# Patient Record
Sex: Female | Born: 1985 | Race: Black or African American | Hispanic: No | Marital: Single | Smoking: Never smoker
Health system: Southern US, Community
[De-identification: ages and names within clinical notes are randomized; demographics above are authoritative.]

## PROBLEM LIST (undated history)

## (undated) ENCOUNTER — Ambulatory Visit (HOSPITAL_COMMUNITY): Admission: EM | Payer: Self-pay | Source: Home / Self Care

## (undated) HISTORY — PX: TYMPANOSTOMY TUBE PLACEMENT: SHX32

## (undated) HISTORY — PX: TUBAL LIGATION: SHX77

---

## 2007-02-26 ENCOUNTER — Inpatient Hospital Stay (HOSPITAL_COMMUNITY): Admission: AD | Admit: 2007-02-26 | Discharge: 2007-02-26 | Payer: Self-pay | Admitting: *Deleted

## 2007-02-26 IMAGING — US US OB COMP LESS 14 WK
1 series · 19 of 28 positions shown · non-contrast
Comparison: none

OBSTETRICAL ULTRASOUND:

 This ultrasound exam was performed in the [HOSPITAL] Ultrasound Department.  The OB US report was generated in the AS system, and faxed to the ordering physician.  This report is also available in [REDACTED] PACS.

[Series 1: us ob comp less 14 wks · 19 of 31 slices shown]
[im 1/31]
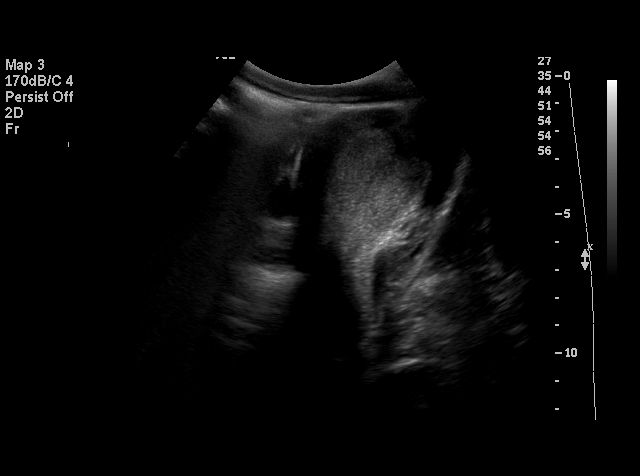
[im 3/31]
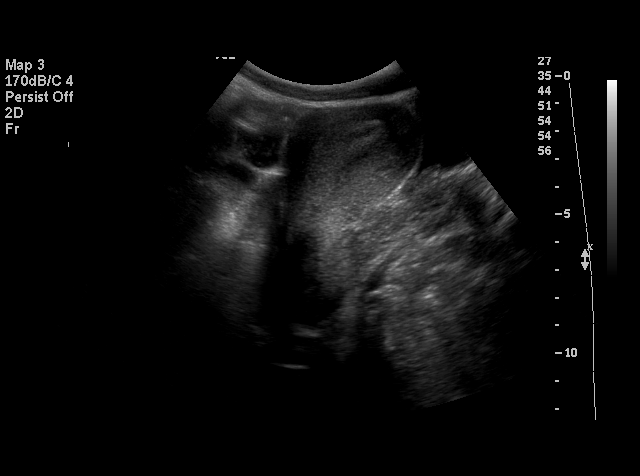
[im 4/31]
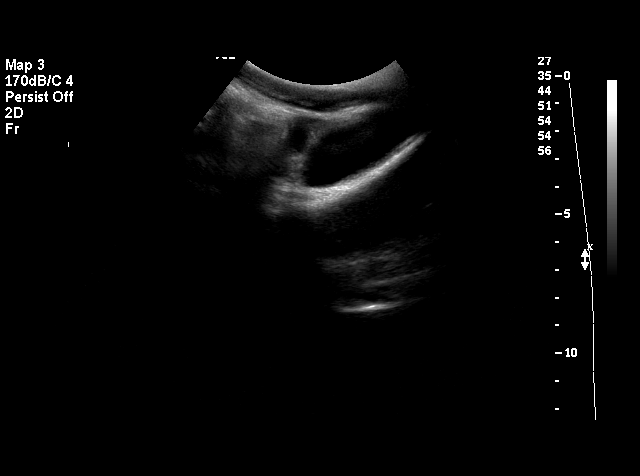
[im 6/31]
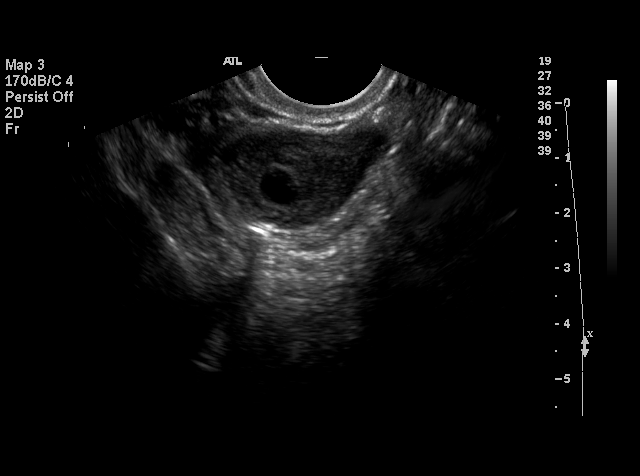
[im 7/31]
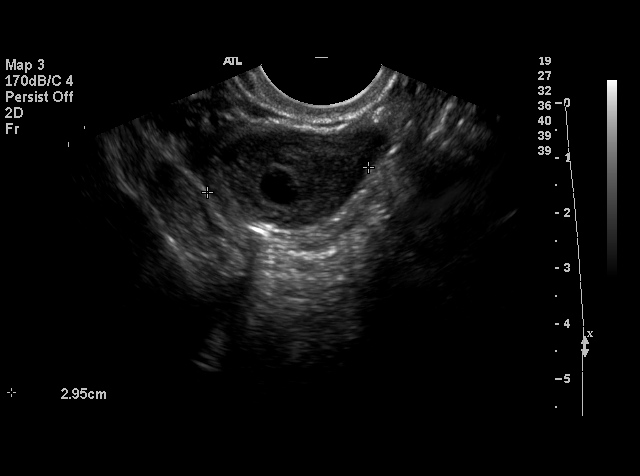
[im 9/31]
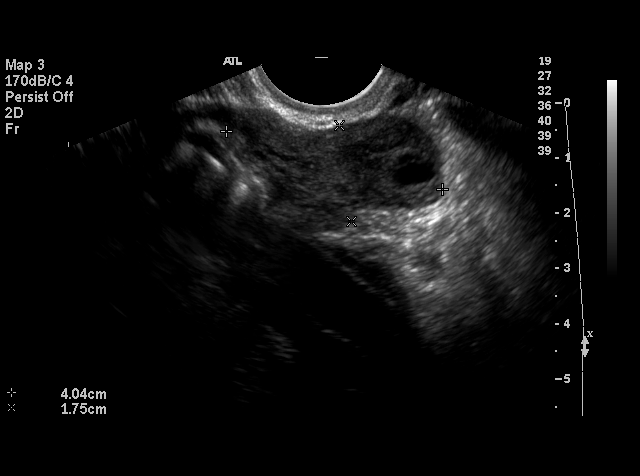
[im 11/31]
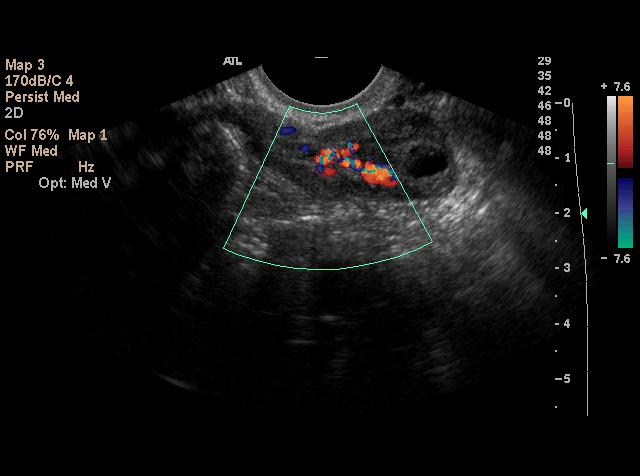
[im 13/31]
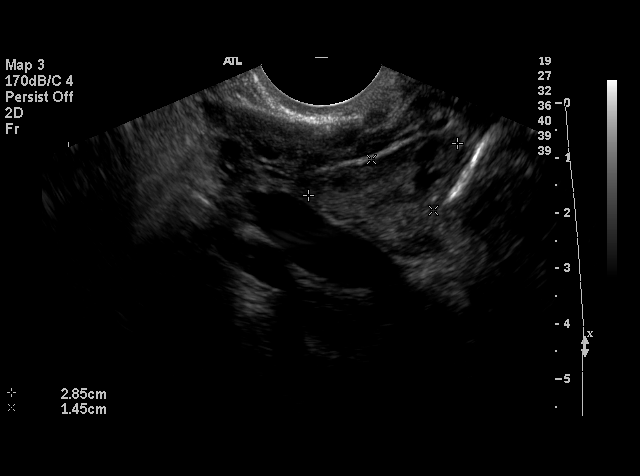
[im 14/31]
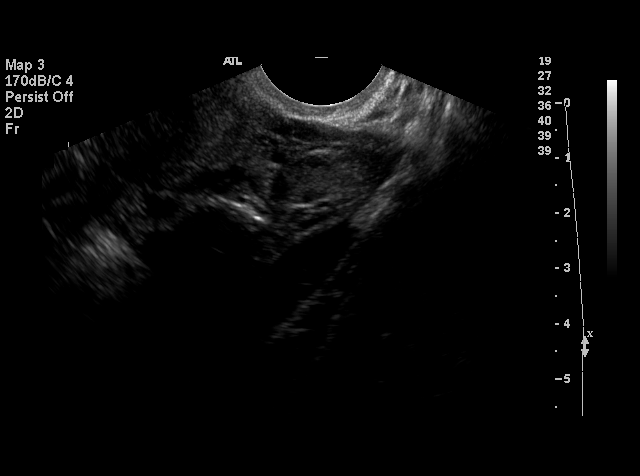
[im 16/31]
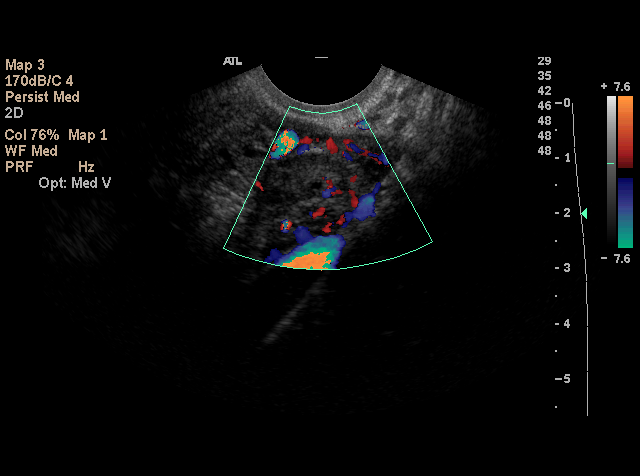
[im 17/31]
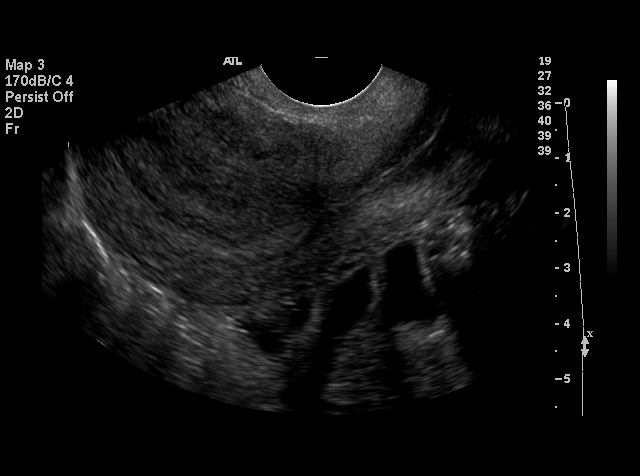
[im 18/31]
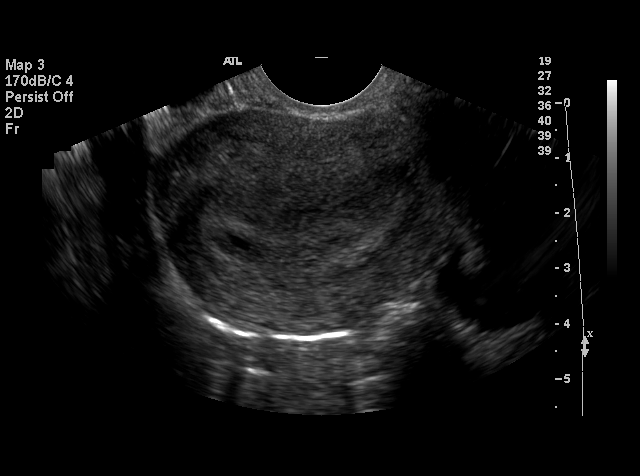
[im 21/31]
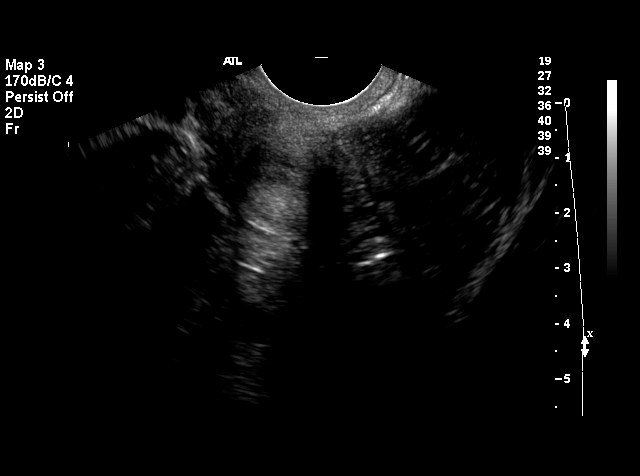
[im 22/31]
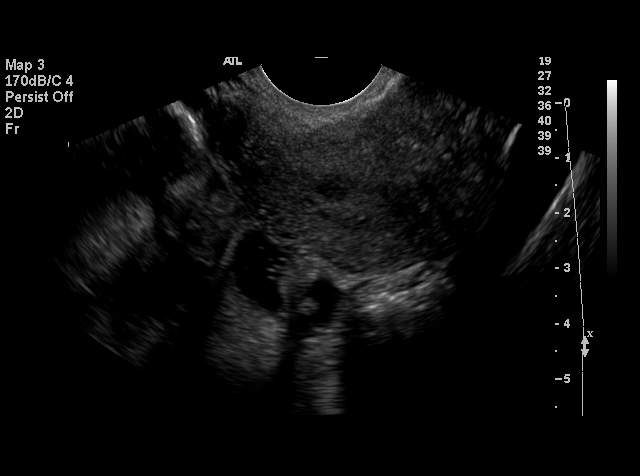
[im 24/31]
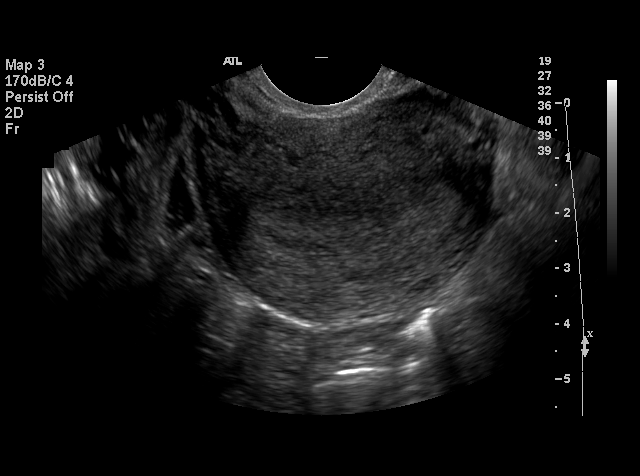
[im 25/31]
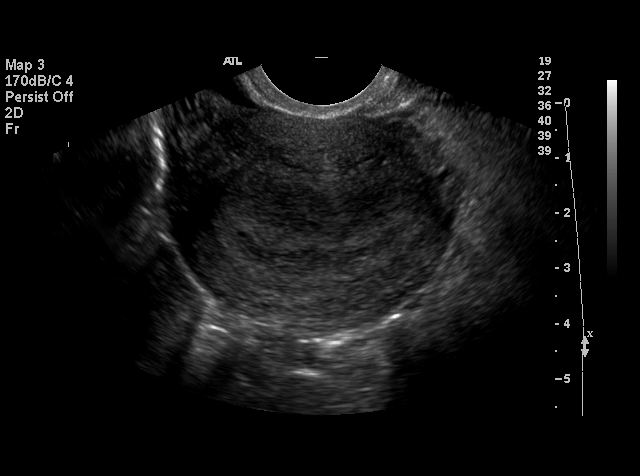
[im 27/31]
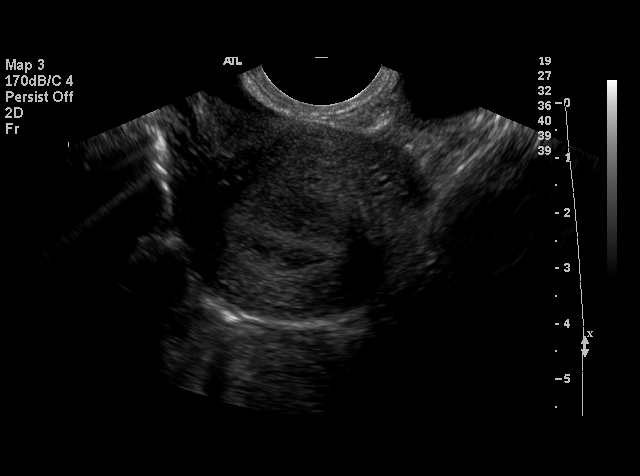
[im 28/31]
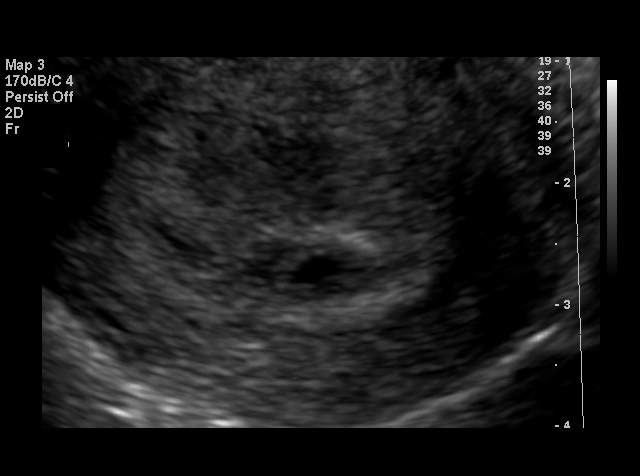
[im 31/31]
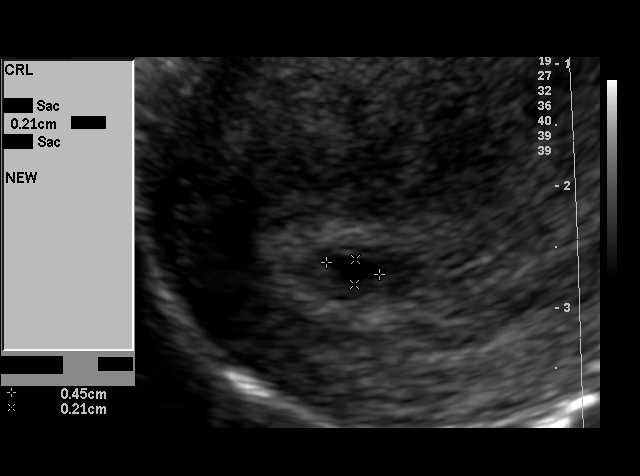

[19 of 28 positions shown; findings below may reference images not displayed]

IMPRESSION: See AS Obstetric US report.

## 2007-02-28 ENCOUNTER — Inpatient Hospital Stay (HOSPITAL_COMMUNITY): Admission: AD | Admit: 2007-02-28 | Discharge: 2007-02-28 | Payer: Self-pay | Admitting: Obstetrics & Gynecology

## 2007-03-07 ENCOUNTER — Inpatient Hospital Stay (HOSPITAL_COMMUNITY): Admission: RE | Admit: 2007-03-07 | Discharge: 2007-03-07 | Payer: Self-pay | Admitting: Obstetrics & Gynecology

## 2007-03-07 IMAGING — US US OB TRANSVAGINAL
1 series · 14 of 23 positions shown · non-contrast
Comparison: none

OBSTETRICAL ULTRASOUND:

 This ultrasound exam was performed in the [HOSPITAL] Ultrasound Department.  The OB US report was generated in the AS system, and faxed to the ordering physician.  This report is also available in [REDACTED] PACS.

[Series 1: us ob transvaginal · 14 of 23 slices shown]
[im 1/23]
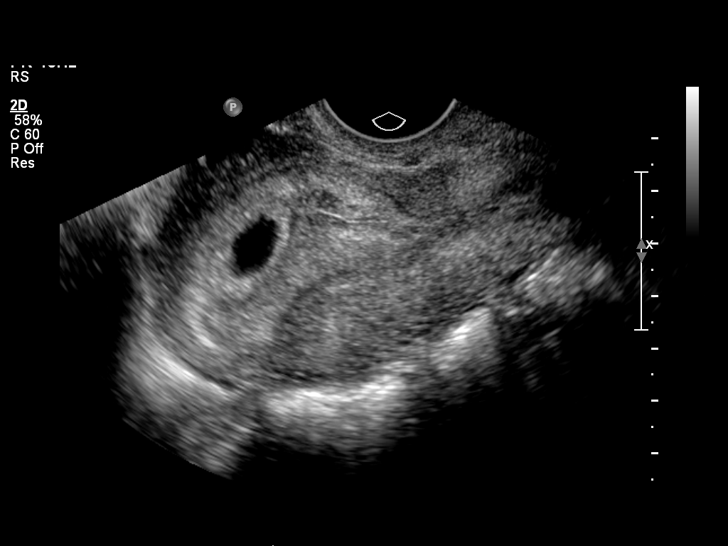
[im 3/23]
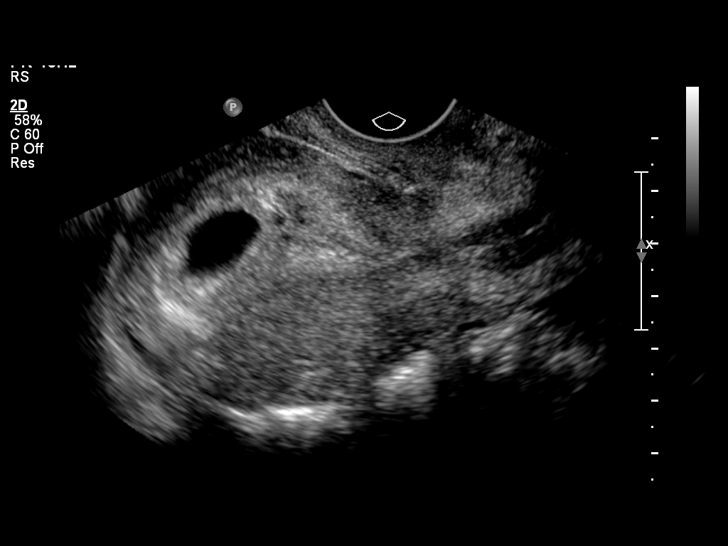
[im 5/23]
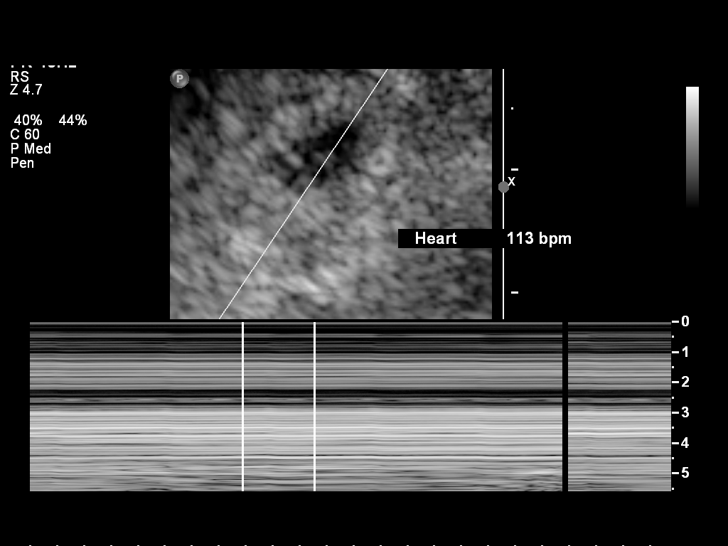
[im 6/23]
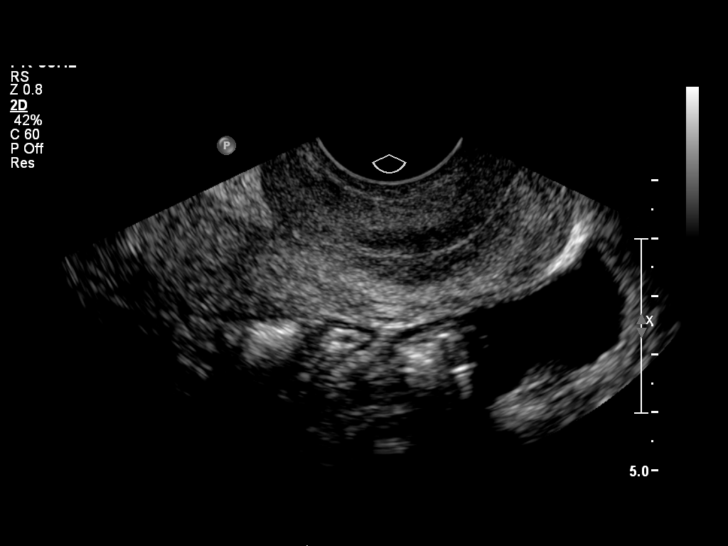
[im 8/23]
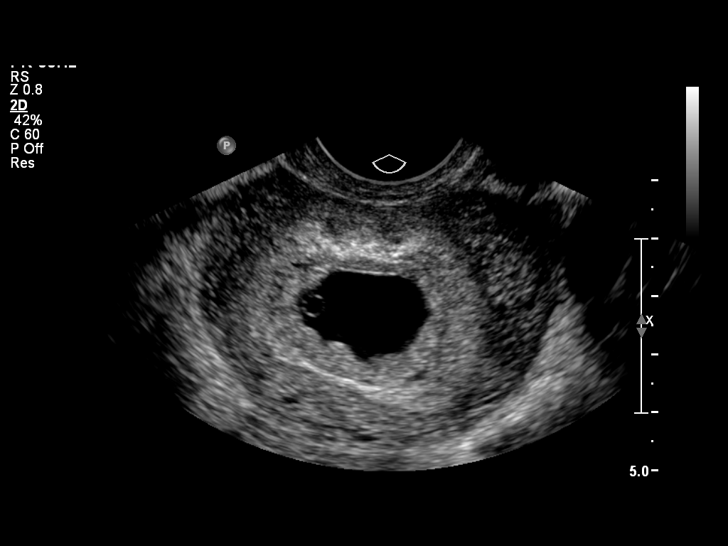
[im 10/23]
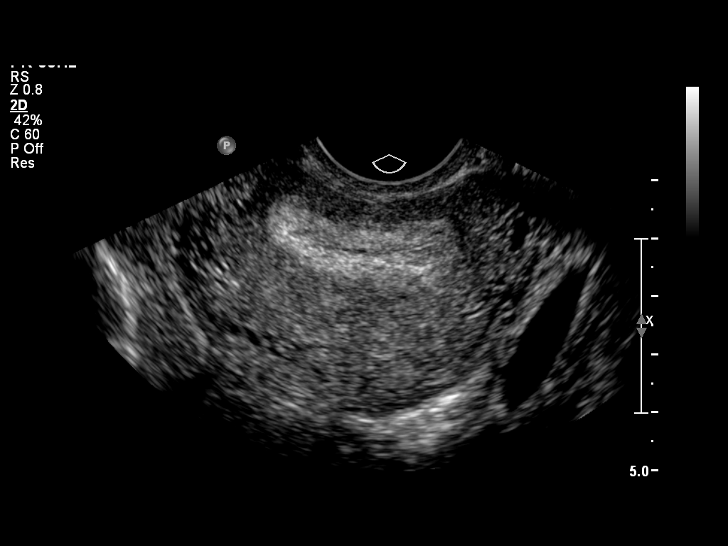
[im 11/23]
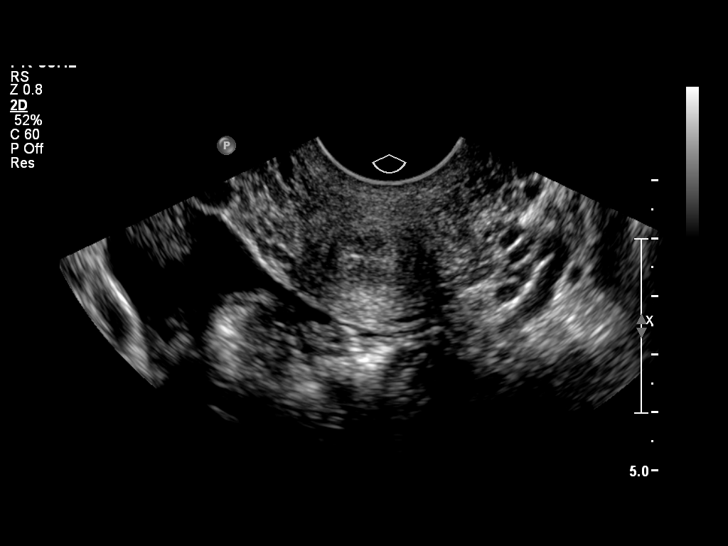
[im 13/23]
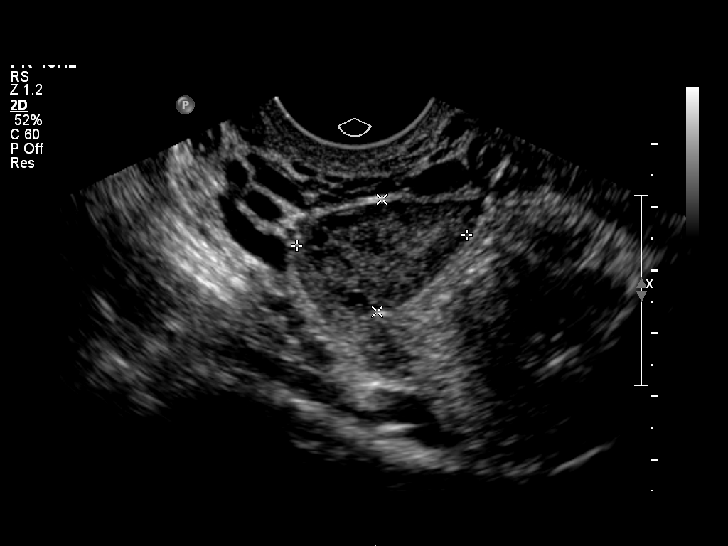
[im 14/23]
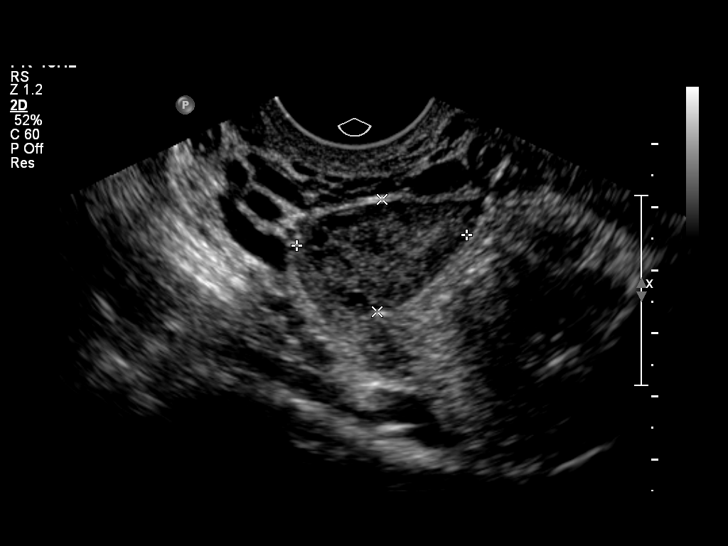
[im 16/23]
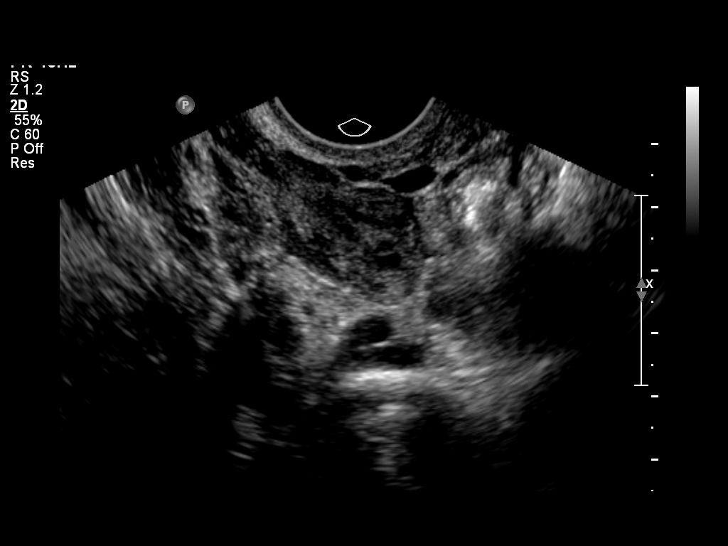
[im 18/23]
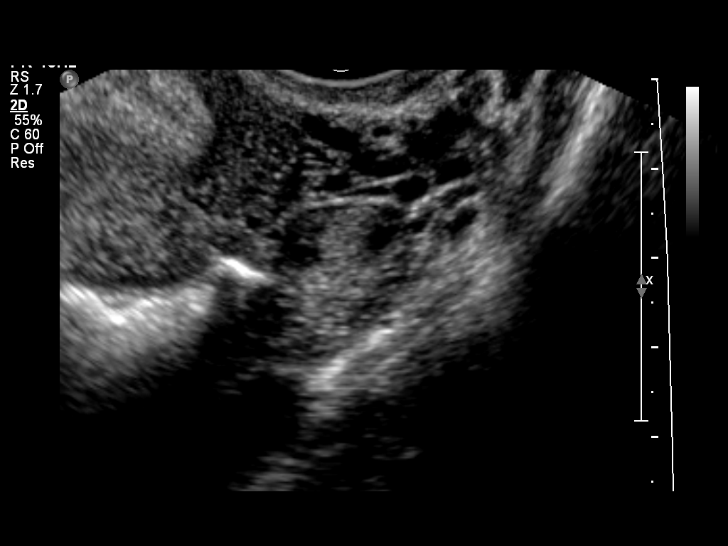
[im 19/23]
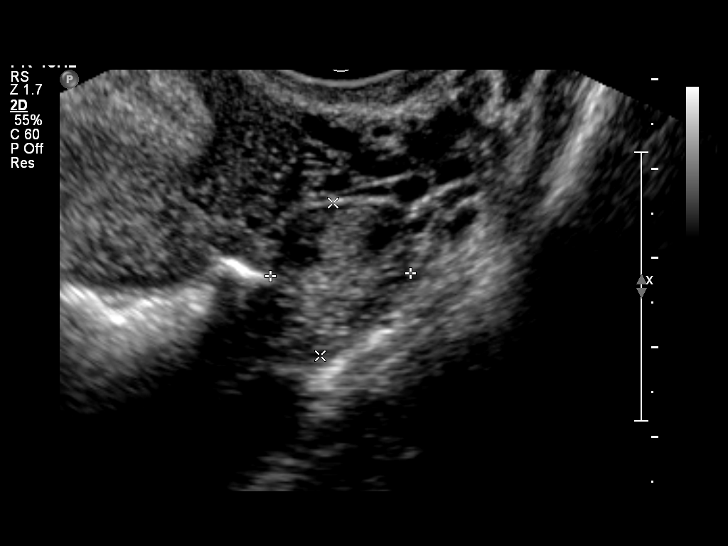
[im 21/23]
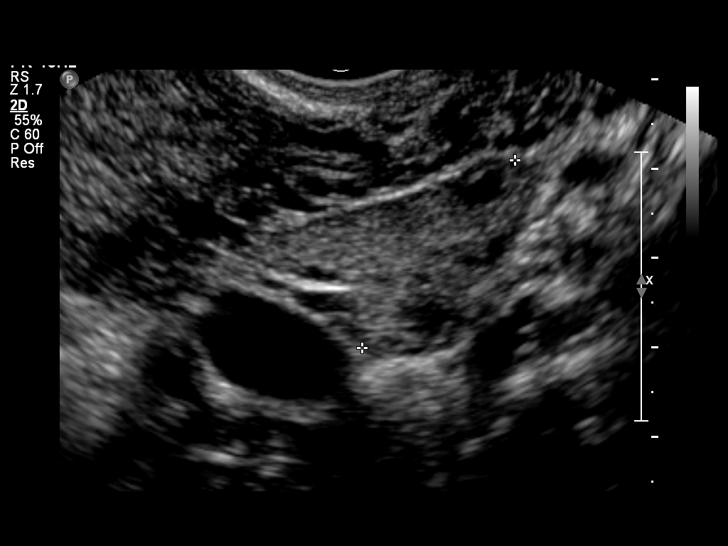
[im 23/23]
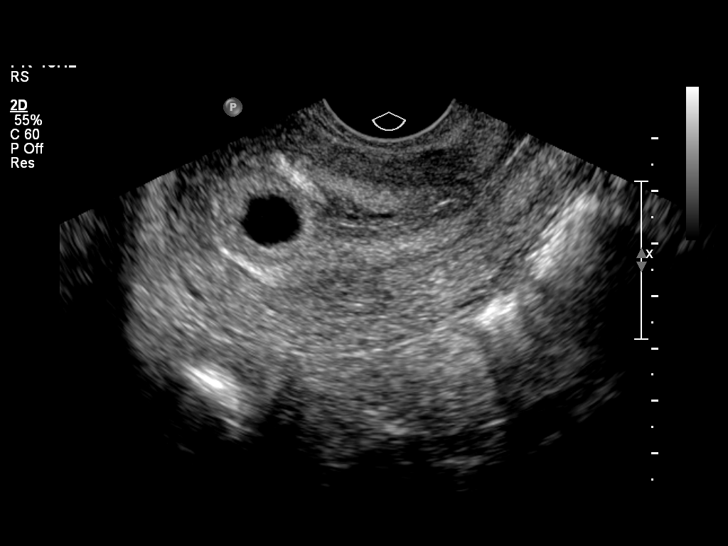

[14 of 23 positions shown; findings below may reference images not displayed]

IMPRESSION: See AS Obstetric US report.

## 2011-04-01 LAB — URINALYSIS, ROUTINE W REFLEX MICROSCOPIC
Bilirubin Urine: NEGATIVE
Glucose, UA: NEGATIVE
Hgb urine dipstick: NEGATIVE
Ketones, ur: NEGATIVE
Protein, ur: NEGATIVE
Urobilinogen, UA: 0.2

## 2011-04-01 LAB — HCG, QUANTITATIVE, PREGNANCY: hCG, Beta Chain, Quant, S: 2806 — ABNORMAL HIGH

## 2011-04-01 LAB — CBC
HCT: 33.8 — ABNORMAL LOW
Platelets: 108 — ABNORMAL LOW
RDW: 16.8 — ABNORMAL HIGH
WBC: 9.7

## 2011-04-01 LAB — WET PREP, GENITAL
Clue Cells Wet Prep HPF POC: NONE SEEN
Trich, Wet Prep: NONE SEEN

## 2011-04-01 LAB — GC/CHLAMYDIA PROBE AMP, GENITAL
Chlamydia, DNA Probe: NEGATIVE
GC Probe Amp, Genital: NEGATIVE

## 2011-04-01 LAB — POCT PREGNANCY, URINE: Preg Test, Ur: POSITIVE

## 2015-05-30 ENCOUNTER — Encounter (HOSPITAL_BASED_OUTPATIENT_CLINIC_OR_DEPARTMENT_OTHER): Payer: Self-pay | Admitting: *Deleted

## 2015-05-30 ENCOUNTER — Emergency Department (HOSPITAL_BASED_OUTPATIENT_CLINIC_OR_DEPARTMENT_OTHER)
Admission: EM | Admit: 2015-05-30 | Discharge: 2015-05-30 | Disposition: A | Discharge status: Home / Self Care | Payer: 59 | Attending: Emergency Medicine | Admitting: Emergency Medicine

## 2015-05-30 DIAGNOSIS — H9203 Otalgia, bilateral: Secondary | ICD-10-CM | POA: Diagnosis not present

## 2015-05-30 DIAGNOSIS — Z79899 Other long term (current) drug therapy: Secondary | ICD-10-CM | POA: Diagnosis not present

## 2015-05-30 DIAGNOSIS — O9989 Other specified diseases and conditions complicating pregnancy, childbirth and the puerperium: Secondary | ICD-10-CM | POA: Diagnosis not present

## 2015-05-30 DIAGNOSIS — Z880 Allergy status to penicillin: Secondary | ICD-10-CM | POA: Diagnosis not present

## 2015-05-30 DIAGNOSIS — O99511 Diseases of the respiratory system complicating pregnancy, first trimester: Secondary | ICD-10-CM | POA: Insufficient documentation

## 2015-05-30 DIAGNOSIS — J069 Acute upper respiratory infection, unspecified: Secondary | ICD-10-CM

## 2015-05-30 NOTE — ED Notes (Signed)
Sinus congestion, bilateral ear pain, pressure behind forehead x 3 days- pt is 3 months pregnant. States she has taken benadryl and tylenol severe sinus last evening

## 2015-05-30 NOTE — ED Provider Notes (Signed)
CSN: [id number]     Arrival date & time 05/30/15  4098 History   First MD Initiated Contact with Patient 05/30/15 1023     Chief Complaint  Patient presents with  . Otalgia     (Consider location/radiation/quality/duration/timing/severity/associated sxs/prior Treatment) HPI  29 year old female who is about 3 months pregnant presents with 3 days of bilateral ear pain/pressure, for head pain/pressure and rhinorrhea. Mild dry cough. Denies fevers or shortness of breath. Has tried some Tylenol and Benadryl can she thought this might be allergic related. Has had some forehead pressure as well. Initially started with a sore throat but that has since resolved. No trouble speaking or swallowing. No neck pain or stiffness. Her rhinorrhea is clear, one time was yellow but denies any green or blood.  Denies vaginal bleeding, abdominal pain, vomiting, or urinary symptoms.  History reviewed. No pertinent past medical history. Past Surgical History  Procedure Laterality Date  . Tympanostomy tube placement     No family history on file. Social History  Substance Use Topics  . Smoking status: Never Smoker   . Smokeless tobacco: Never Used  . Alcohol Use: No   OB History    Gravida Para Term Preterm AB TAB SAB Ectopic Multiple Living   1              Review of Systems  Constitutional: Negative for fever.  HENT: Positive for congestion, ear pain, rhinorrhea, sinus pressure and sore throat. Negative for trouble swallowing and voice change.   Respiratory: Positive for cough.   Cardiovascular: Negative for chest pain.  Gastrointestinal: Negative for vomiting.  All other systems reviewed and are negative.     Allergies  Amoxicillin  Home Medications   Prior to Admission medications   Medication Sig Start Date End Date Taking? Authorizing Provider  Prenatal Vit-Fe Fumarate-FA (PRENATAL MULTIVITAMIN) TABS tablet Take 1 tablet by mouth daily at 12 noon.   Yes Historical Provider, MD   BP  108/61 mmHg  Pulse 90  Temp(Src) 98.4 F (36.9 C) (Oral)  Resp 16  Ht  (1.676 m)  Wt 123 lb (55.792 kg)  BMI 19.86 kg/m2  SpO2 100%  LMP 03/02/2015 Physical Exam  Constitutional: She is oriented to person, place, and time. She appears well-developed and well-nourished.  HENT:  Head: Normocephalic and atraumatic.  Right Ear: Tympanic membrane, external ear and ear canal normal.  Left Ear: Tympanic membrane, external ear and ear canal normal.  Nose: Nose normal. Right sinus exhibits no maxillary sinus tenderness and no frontal sinus tenderness. Left sinus exhibits no maxillary sinus tenderness and no frontal sinus tenderness.  Eyes: Right eye exhibits no discharge. Left eye exhibits no discharge.  Cardiovascular: Normal rate, regular rhythm and normal heart sounds.   Pulmonary/Chest: Effort normal and breath sounds normal.  Abdominal: Soft. There is no tenderness.  Neurological: She is alert and oriented to person, place, and time.  Skin: Skin is warm and dry.  Nursing note and vitals reviewed.   ED Course  Procedures (including critical care time) Labs Review Labs Reviewed - No data to display  Imaging Review No results found. I have personally reviewed and evaluated these images and lab results as part of my medical decision-making.   EKG Interpretation None      MDM   Final diagnoses:  Upper respiratory infection    Patient symptoms consistent with mild upper respiratory infection. No fevers. Highly doubt bacterial sinusitis or other acute bacterial illness. Lungs are clear with only a  minimal cough that is likely more congestion related. Given she is pregnant I have recommended she discuss with the pharmacist at a drugstore before buying any symptomatic care. She is in no distress.    Pricilla LovelessScott Burnett Spray, MD 05/30/15 [phone]

## 2021-07-24 ENCOUNTER — Emergency Department (HOSPITAL_BASED_OUTPATIENT_CLINIC_OR_DEPARTMENT_OTHER)
Admission: EM | Admit: 2021-07-24 | Discharge: 2021-07-24 | Disposition: A | Discharge status: Home / Self Care | Payer: 59 | Attending: Emergency Medicine | Admitting: Emergency Medicine

## 2021-07-24 ENCOUNTER — Encounter (HOSPITAL_BASED_OUTPATIENT_CLINIC_OR_DEPARTMENT_OTHER): Payer: Self-pay | Admitting: Emergency Medicine

## 2021-07-24 ENCOUNTER — Other Ambulatory Visit: Payer: Self-pay

## 2021-07-24 DIAGNOSIS — K644 Residual hemorrhoidal skin tags: Secondary | ICD-10-CM | POA: Diagnosis present

## 2021-07-24 DIAGNOSIS — K649 Unspecified hemorrhoids: Secondary | ICD-10-CM

## 2021-07-24 MED ORDER — POLYETHYLENE GLYCOL 3350 17 GM/SCOOP PO POWD: 17.0000 g | Freq: Every day | ORAL | 0 refills | Status: DC

## 2021-07-24 NOTE — ED Provider Notes (Signed)
[place] EMERGENCY DEPARTMENT Provider Note   CSN: [id number] Arrival date & time: 07/24/21  1044     History Chief Complaint  Patient presents with   Rectal Bleeding    Kayla Ayala is a 36 y.o. female presents to the ED for evaluation of hemorrhoids and some bright red blood per rectum and on toilet tissue last night and some this morning.  She has been experiencing hemorrhoids for the past 2 weeks intermittently.  She was seen by her OB/GYN a few days ago and given hydrocortisone cream for them.  She reports she is still having pain and noticed the blood on the tissue last night.  She reports she has had this before with her other hemorrhoids.  Denies any dysuria, hematuria, urgency urgency/frequency.  Denies any abdominal pain.  She reports she has been constipated off and on for the past 2 weeks. She tried a laxative last week and found resolution of pain and symptoms, but the pain returned. She reports that she drinks water and stays active with her job.    Rectal Bleeding Associated symptoms: no abdominal pain, no fever and no vomiting       Home Medications Prior to Admission medications   Medication Sig Start Date End Date Taking? Authorizing Provider  Prenatal Vit-Fe Fumarate-FA (PRENATAL MULTIVITAMIN) TABS tablet Take 1 tablet by mouth daily at 12 noon.    [provider]      Allergies    Amoxicillin    Review of Systems   Review of Systems  Constitutional:  Negative for chills and fever.  Gastrointestinal:  Positive for anal bleeding, constipation, hematochezia and rectal pain. Negative for abdominal pain, nausea and vomiting.  Genitourinary:  Negative for dysuria, frequency, hematuria and urgency.   Physical Exam Updated Vital Signs BP 113/72 (BP Location: Right Arm)    Pulse (!) 119    Temp 98.6 F (37 C) (Oral)    Resp 18    Ht 5\' 3"  (1.6 m)    Wt 67.1 kg    LMP 07/07/2021    SpO2 100%    BMI 26.22 kg/m  Physical Exam Vitals and  nursing note reviewed. Exam conducted with a chaperone present Apolonio Schneiders, Therapist, sports).  Constitutional:      General: She is not in acute distress.    Appearance: Normal appearance. She is not toxic-appearing.  HENT:     Head: Normocephalic and atraumatic.  Eyes:     General: No scleral icterus. Cardiovascular:     Rate and Rhythm: Normal rate and regular rhythm.  Pulmonary:     Effort: Pulmonary effort is normal. No respiratory distress.     Breath sounds: Normal breath sounds.  Abdominal:     General: Bowel sounds are normal. There is no distension.     Palpations: Abdomen is soft.     Tenderness: There is no abdominal tenderness. There is no guarding or rebound.  Genitourinary:    Rectum: Tenderness and external hemorrhoid present.     Comments: Small, pea-sized hemorrhoid at the 6 o'clock position. No bleeding. Not thrombosed. Clay colored stool noted. Tenderness. Normal rectal tone. Musculoskeletal:        General: No deformity.     Cervical back: Normal range of motion.  Skin:    General: Skin is warm and dry.  Neurological:     General: No focal deficit present.     Mental Status: She is alert. Mental status is at baseline.    ED Results /  Procedures / Treatments   Labs (all labs ordered are listed, but only abnormal results are displayed) Labs Reviewed - No data to display  EKG None  Radiology No results found.  Procedures Procedures   Medications Ordered in ED Medications - No data to display  ED Course/ Medical Decision Making/ A&P                           Medical Decision Making  36 year old female presents emergency room for evaluation of rectal pain, bleeding, and constipation.  Differential diagnosis includes but is not limited to thrombosed hemorrhoid, hemorrhoid, fistula, abscess.  Vital signs initially show tachycardia, however this is improved.  Hemodynamically stable currently.  Physical exam pertinent for a small pea-sized hemorrhoid located at the 6  o'clock position of the rectum.  No palpable abscess or induration noted.  Low suspicion for fistula at this time.  No bleeding noted on digital rectal exam.  Clay colored stool.  Recommended MiraLAX until she produces a bowel movement with the consistency of peanut butter.  Additionally recommended for her to continue using the hydrocortisone topical ointment.  I discussed with her that she will need to relieve her constipation as this is what is likely causing her hemorrhoid pain.  Recommended increase in fluid, activity, and fiber.  Additionally, gave information to the general surgeon for follow-up with if this problem does not improve.  Strict return precautions discussed.  Patient agrees with plan.  Patient is stable being discharged home in good condition.  Final Clinical Impression(s) / ED Diagnoses Final diagnoses:  Hemorrhoids, unspecified hemorrhoid type    Rx / DC Orders ED Discharge Orders          Ordered    polyethylene glycol powder (MIRALAX) 17 GM/SCOOP powder  Daily        07/24/21 1156              Sherrell Puller, PA-C 07/24/21 1201    Malvin Johns, MD 07/24/21 1217

## 2021-07-24 NOTE — Discharge Instructions (Addendum)
You were seen here today for evaluation of your rectal pain. You have a small hemmorhoid. Please continue using topical steroid cream given to you by your gynecologist.  Additionally, I have written you a prescription for MiraLAX.  If your insurance does not cover this, you can pick up MiraLAX or generic of it over-the-counter.  Please take 2 capfuls a day with plenty of water.  Additionally I would recommend adding fiber into your diet to relieve some your constipation which is likely the source of your hemorrhoids.  If you do not have any relief with this, I have given the information of a general surgeon to follow-up with.  If you have any worsening rectal pain, increased bleeding, abdominal pain, dark or tarry stools, please return the nearest emergency department for reevaluation.

## 2021-07-24 NOTE — ED Triage Notes (Signed)
Pt arrives pov, ambulatory to triage with c/o bloody stool last night with rectal pain/pressure. Reports constipation. Denies fever, reports  lower abdominal cramping, endorses time for menstrual cycle.

## 2023-01-16 ENCOUNTER — Emergency Department (HOSPITAL_BASED_OUTPATIENT_CLINIC_OR_DEPARTMENT_OTHER)
Admission: EM | Admit: 2023-01-16 | Discharge: 2023-01-16 | Disposition: A | Discharge status: Home / Self Care | Payer: 59 | Attending: Emergency Medicine | Admitting: Emergency Medicine

## 2023-01-16 ENCOUNTER — Other Ambulatory Visit: Payer: Self-pay

## 2023-01-16 ENCOUNTER — Emergency Department (HOSPITAL_BASED_OUTPATIENT_CLINIC_OR_DEPARTMENT_OTHER): Payer: 59

## 2023-01-16 ENCOUNTER — Encounter (HOSPITAL_BASED_OUTPATIENT_CLINIC_OR_DEPARTMENT_OTHER): Payer: Self-pay | Admitting: Emergency Medicine

## 2023-01-16 DIAGNOSIS — K8020 Calculus of gallbladder without cholecystitis without obstruction: Secondary | ICD-10-CM | POA: Insufficient documentation

## 2023-01-16 DIAGNOSIS — N83202 Unspecified ovarian cyst, left side: Secondary | ICD-10-CM | POA: Insufficient documentation

## 2023-01-16 DIAGNOSIS — R1030 Lower abdominal pain, unspecified: Secondary | ICD-10-CM | POA: Diagnosis present

## 2023-01-16 LAB — CBC
HCT: 35.6 % — ABNORMAL LOW (ref 36.0–46.0)
Hemoglobin: 11.6 g/dL — ABNORMAL LOW (ref 12.0–15.0)
MCH: 30 pg (ref 26.0–34.0)
MCHC: 32.6 g/dL (ref 30.0–36.0)
MCV: 92 fL (ref 80.0–100.0)
Platelets: 220 10*3/uL (ref 150–400)
RBC: 3.87 MIL/uL (ref 3.87–5.11)
RDW: 14.2 % (ref 11.5–15.5)
WBC: 10.9 10*3/uL — ABNORMAL HIGH (ref 4.0–10.5)
nRBC: 0 % (ref 0.0–0.2)

## 2023-01-16 LAB — COMPREHENSIVE METABOLIC PANEL
ALT: 12 U/L (ref 0–44)
AST: 18 U/L (ref 15–41)
Albumin: 4.6 g/dL (ref 3.5–5.0)
Alkaline Phosphatase: 59 U/L (ref 38–126)
Anion gap: 9 (ref 5–15)
BUN: 6 mg/dL (ref 6–20)
CO2: 27 mmol/L (ref 22–32)
Calcium: 9.4 mg/dL (ref 8.9–10.3)
Chloride: 101 mmol/L (ref 98–111)
Creatinine, Ser: 0.77 mg/dL (ref 0.44–1.00)
GFR, Estimated: >60 mL/min (ref >60)
Glucose, Bld: 108 mg/dL — ABNORMAL HIGH (ref 70–99)
Potassium: 3.8 mmol/L (ref 3.5–5.1)
Sodium: 137 mmol/L (ref 135–145)
Total Bilirubin: 0.3 mg/dL (ref 0.3–1.2)
Total Protein: 7.7 g/dL (ref 6.5–8.1)

## 2023-01-16 LAB — URINALYSIS, ROUTINE W REFLEX MICROSCOPIC
Bilirubin Urine: NEGATIVE
Glucose, UA: NEGATIVE mg/dL
Hgb urine dipstick: NEGATIVE
Ketones, ur: NEGATIVE mg/dL
Leukocytes,Ua: NEGATIVE
Nitrite: NEGATIVE
Protein, ur: NEGATIVE mg/dL
Specific Gravity, Urine: 1.009 (ref 1.005–1.030)
pH: 6.5 (ref 5.0–8.0)

## 2023-01-16 LAB — LIPASE, BLOOD: Lipase: 19 U/L (ref 11–51)

## 2023-01-16 LAB — PREGNANCY, URINE: Preg Test, Ur: NEGATIVE

## 2023-01-16 MED ORDER — KETOROLAC TROMETHAMINE 30 MG/ML IJ SOLN
15.0000 mg | Freq: Once | INTRAMUSCULAR | Status: AC
  Administered 2023-01-16: 15 mg via INTRAVENOUS
  Filled 2023-01-16: qty 1

## 2023-01-16 MED ORDER — NAPROXEN 500 MG PO TABS: 500.0000 mg | ORAL_TABLET | Freq: Two times a day (BID) | ORAL | 0 refills | Status: DC

## 2023-01-16 MED ORDER — ONDANSETRON 4 MG PO TBDP
4.0000 mg | ORAL_TABLET | Freq: Once | ORAL | Status: AC | PRN
  Administered 2023-01-16: 4 mg via ORAL
  Filled 2023-01-16: qty 1

## 2023-01-16 MED ORDER — IOHEXOL 300 MG/ML  SOLN
100.0000 mL | Freq: Once | INTRAMUSCULAR | Status: AC | PRN
  Administered 2023-01-16: 100 mL via INTRAVENOUS

## 2023-01-16 NOTE — ED Provider Notes (Signed)
EMERGENCY DEPARTMENT AT [facility]  Provider Note  CSN: [id number] Arrival date & time: 01/16/23 0108  History Chief Complaint  Patient presents with   Abdominal Pain    Kayla Ayala is a 37 y.o. female with no significant PMH reports onset of lower abdominal cramping earlier in the day on 7/28 she thought might be onset of menses. She has not had a period in over a month, not uncommon for her to be irregular. Later in the evening pain intensified and was in her lower back as well which is unusual for her. She had some nausea but no vomiting, diarrhea, constipation, dysuria or vaginal discharge. She is feeling some better since arrival but still having mild residual pain.    Home Medications Prior to Admission medications   Medication Sig Start Date End Date Taking? Authorizing Provider  naproxen (NAPROSYN) 500 MG tablet Take 1 tablet (500 mg total) by mouth 2 (two) times daily. 01/16/23  Yes Pollyann Savoy, MD  polyethylene glycol powder [facility]) 17 GM/SCOOP powder Take 17 g by mouth daily. 07/24/21   Achille Rich, PA-C  Prenatal Vit-Fe Fumarate-FA (PRENATAL MULTIVITAMIN) TABS tablet Take 1 tablet by mouth daily at 12 noon.    [provider]     Allergies    Amoxicillin   Review of Systems   Review of Systems Please see HPI for pertinent positives and negatives  Physical Exam BP 117/82   Pulse 90   Temp 97.9 F (36.6 C) (Oral)   Resp 18   Wt 72.6 kg   LMP 12/01/2022 (Approximate)   SpO2 100%   BMI 28.34 kg/m   Physical Exam Vitals and nursing note reviewed.  Constitutional:      Appearance: Normal appearance.  HENT:     Head: Normocephalic and atraumatic.     Nose: Nose normal.     Mouth/Throat:     Mouth: Mucous membranes are moist.  Eyes:     Extraocular Movements: Extraocular movements intact.     Conjunctiva/sclera: Conjunctivae normal.  Cardiovascular:     Rate and Rhythm: Normal rate.  Pulmonary:     Effort:  Pulmonary effort is normal.     Breath sounds: Normal breath sounds.  Abdominal:     General: Abdomen is flat.     Palpations: Abdomen is soft.     Tenderness: There is no abdominal tenderness. There is no guarding. Negative signs include Murphy's sign and McBurney's sign.  Musculoskeletal:        General: No swelling. Normal range of motion.     Cervical back: Neck supple.  Skin:    General: Skin is warm and dry.  Neurological:     General: No focal deficit present.     Mental Status: She is alert.  Psychiatric:        Mood and Affect: Mood normal.     ED Results / Procedures / Treatments   EKG None  Procedures Procedures  Medications Ordered in the ED Medications  ondansetron (ZOFRAN-ODT) disintegrating tablet 4 mg (4 mg Oral Given 01/16/23 0125)  ketorolac (TORADOL) 30 MG/ML injection 15 mg (15 mg Intravenous Given 01/16/23 0407)  iohexol (OMNIPAQUE) 300 MG/ML solution 100 mL (100 mLs Intravenous Contrast Given 01/16/23 0456)    Initial Impression and Plan  Patient here with cramping lower abdominal pain in setting of irregular menses. Some pain radiating into back, consider renal colic vs dysmenorrhea. Less likely vascular problem, pelvic infection or endometriosis. Labs done in triage  show unremarkable CBC, CMP, Lipase, HCG and UA. Will give a dose of toradol for pain and send for CT to rule out acute process.   ED Course   Clinical Course as of 01/16/23 0551  Mon Jan 16, 2023  4259 I personally viewed the images from radiology studies and agree with radiologist interpretation:  CT neg for acute process. There are gall stones, not likely causing her symptoms and a hemorrhagic ovarian cyst which might be contributing. Her pain is well controlled now. Plan discharged with Rx for naprosyn and outpatiient Gyn follow up. RTED for any other concerns.  [CS]    Clinical Course User Index [CS] Pollyann Savoy, MD     MDM Rules/Calculators/A&P Medical Decision  Making Problems Addressed: Cyst of left ovary: acute illness or injury Gall stones: acute illness or injury  Amount and/or Complexity of Data Reviewed Labs: ordered. Decision-making details documented in ED Course. Radiology: ordered and independent interpretation performed. Decision-making details documented in ED Course.  Risk Prescription drug management.     Final Clinical Impression(s) / ED Diagnoses Final diagnoses:  Cyst of left ovary  Gall stones    Rx / DC Orders ED Discharge Orders          Ordered    naproxen (NAPROSYN) 500 MG tablet  2 times daily        01/16/23 0551             Pollyann Savoy, MD 01/16/23 [phone]

## 2023-01-16 NOTE — ED Triage Notes (Addendum)
Presents for lower abd cramping starting yesterday and lower midline back pain that woke her from sleep. Unable to get comfortable.  Endorses nausea Denies fever, urinary sx, recent falls, vaginal bleeding or discharge Last BM Sunday afternoon.    H/o tubal ligation

## 2023-02-02 ENCOUNTER — Encounter (HOSPITAL_BASED_OUTPATIENT_CLINIC_OR_DEPARTMENT_OTHER): Payer: Self-pay | Admitting: Emergency Medicine

## 2023-02-02 ENCOUNTER — Other Ambulatory Visit: Payer: Self-pay

## 2023-02-02 DIAGNOSIS — R1032 Left lower quadrant pain: Secondary | ICD-10-CM | POA: Insufficient documentation

## 2023-02-02 LAB — COMPREHENSIVE METABOLIC PANEL
ALT: 11 U/L (ref 0–44)
AST: 16 U/L (ref 15–41)
Albumin: 4.6 g/dL (ref 3.5–5.0)
Alkaline Phosphatase: 62 U/L (ref 38–126)
Anion gap: 10 (ref 5–15)
BUN: 9 mg/dL (ref 6–20)
CO2: 28 mmol/L (ref 22–32)
Calcium: 9.7 mg/dL (ref 8.9–10.3)
Chloride: 100 mmol/L (ref 98–111)
Creatinine, Ser: 0.82 mg/dL (ref 0.44–1.00)
GFR, Estimated: >60 mL/min (ref >60)
Glucose, Bld: 104 mg/dL — ABNORMAL HIGH (ref 70–99)
Potassium: 3.4 mmol/L — ABNORMAL LOW (ref 3.5–5.1)
Sodium: 138 mmol/L (ref 135–145)
Total Bilirubin: 0.4 mg/dL (ref 0.3–1.2)
Total Protein: 7.8 g/dL (ref 6.5–8.1)

## 2023-02-02 LAB — URINALYSIS, ROUTINE W REFLEX MICROSCOPIC
Bacteria, UA: NONE SEEN
Bilirubin Urine: NEGATIVE
Glucose, UA: NEGATIVE mg/dL
Ketones, ur: 15 mg/dL — AB
Leukocytes,Ua: NEGATIVE
Nitrite: NEGATIVE
Protein, ur: NEGATIVE mg/dL
Specific Gravity, Urine: 1.024 (ref 1.005–1.030)
pH: 6.5 (ref 5.0–8.0)

## 2023-02-02 LAB — CBC
HCT: 34.3 % — ABNORMAL LOW (ref 36.0–46.0)
Hemoglobin: 11 g/dL — ABNORMAL LOW (ref 12.0–15.0)
MCH: 29.5 pg (ref 26.0–34.0)
MCHC: 32.1 g/dL (ref 30.0–36.0)
MCV: 92 fL (ref 80.0–100.0)
Platelets: 211 10*3/uL (ref 150–400)
RBC: 3.73 MIL/uL — ABNORMAL LOW (ref 3.87–5.11)
RDW: 14.2 % (ref 11.5–15.5)
WBC: 11.2 10*3/uL — ABNORMAL HIGH (ref 4.0–10.5)
nRBC: 0 % (ref 0.0–0.2)

## 2023-02-02 LAB — PREGNANCY, URINE: Preg Test, Ur: NEGATIVE

## 2023-02-02 LAB — LIPASE, BLOOD: Lipase: 17 U/L (ref 11–51)

## 2023-02-02 NOTE — ED Triage Notes (Signed)
Pt c/o generalized abd pain with nausea and back pain since 1400-1500 today, reports was seen for same x 2 weeks ago, denies fever/emesis/diarrhea

## 2023-02-03 ENCOUNTER — Emergency Department (HOSPITAL_BASED_OUTPATIENT_CLINIC_OR_DEPARTMENT_OTHER)
Admission: EM | Admit: 2023-02-03 | Discharge: 2023-02-03 | Disposition: A | Discharge status: Home / Self Care | Payer: 59 | Attending: Emergency Medicine | Admitting: Emergency Medicine

## 2023-02-03 DIAGNOSIS — R109 Unspecified abdominal pain: Secondary | ICD-10-CM

## 2023-02-03 MED ORDER — NAPROXEN 500 MG PO TABS: 500.0000 mg | ORAL_TABLET | Freq: Two times a day (BID) | ORAL | 0 refills | Status: DC

## 2023-02-03 NOTE — Discharge Instructions (Signed)
Begin taking naproxen as prescribed  Rest.  Follow-up with primary doctor or GYN as needed if symptoms are not improving in the next 3 to 4 days.

## 2023-02-03 NOTE — ED Provider Notes (Signed)
[place] EMERGENCY DEPARTMENT AT [facility] Provider Note   CSN: [id number] Arrival date & time: 02/02/23  2250     History  Chief Complaint  Patient presents with   Abdominal Pain    Kayla Ayala is a 37 y.o. female.  Patient is a 37 year old female with history of tubal ligation.  Patient presenting today for evaluation of left flank and abdominal pain.  This started suddenly today while she was at work.  Symptoms have since resolved at the time of my evaluation.  She denies any fevers or chills.  She denies any bowel or bladder complaints.  She denies any injury or trauma.  She was seen here 2 weeks ago with a similar presentation.  She underwent laboratory studies and CT scan, but all were unremarkable.  The history is provided by the patient.       Home Medications Prior to Admission medications   Medication Sig Start Date End Date Taking? Authorizing Provider  naproxen (NAPROSYN) 500 MG tablet Take 1 tablet (500 mg total) by mouth 2 (two) times daily. 01/16/23   Pollyann Savoy, MD  polyethylene glycol powder [facility]) 17 GM/SCOOP powder Take 17 g by mouth daily. 07/24/21   Achille Rich, PA-C  Prenatal Vit-Fe Fumarate-FA (PRENATAL MULTIVITAMIN) TABS tablet Take 1 tablet by mouth daily at 12 noon.    [provider]      Allergies    Amoxicillin    Review of Systems   Review of Systems  All other systems reviewed and are negative.   Physical Exam Updated Vital Signs BP 125/81 (BP Location: Right Arm)   Pulse 85   Temp 98 F (36.7 C) (Oral)   Resp 16   Ht 5\' 3"  (1.6 m)   Wt 72.6 kg   LMP 12/01/2022 (Approximate) Comment: tubal ligation  SpO2 100%   BMI 28.34 kg/m  Physical Exam Vitals and nursing note reviewed.  Constitutional:      General: She is not in acute distress.    Appearance: She is well-developed. She is not diaphoretic.  HENT:     Head: Normocephalic and atraumatic.  Cardiovascular:     Rate and Rhythm: Normal rate  and regular rhythm.     Heart sounds: No murmur heard.    No friction rub. No gallop.  Pulmonary:     Effort: Pulmonary effort is normal. No respiratory distress.     Breath sounds: Normal breath sounds. No wheezing.  Abdominal:     General: Bowel sounds are normal. There is no distension.     Palpations: Abdomen is soft.     Tenderness: There is no abdominal tenderness.  Musculoskeletal:        General: Normal range of motion.     Cervical back: Normal range of motion and neck supple.  Skin:    General: Skin is warm and dry.  Neurological:     General: No focal deficit present.     Mental Status: She is alert and oriented to person, place, and time.     ED Results / Procedures / Treatments   Labs (all labs ordered are listed, but only abnormal results are displayed) Labs Reviewed  COMPREHENSIVE METABOLIC PANEL - Abnormal; Notable for the following components:      Result Value   Potassium 3.4 (*)    Glucose, Bld 104 (*)    All other components within normal limits  CBC - Abnormal; Notable for the following components:   WBC 11.2 (*)  RBC 3.73 (*)    Hemoglobin 11.0 (*)    HCT 34.3 (*)    All other components within normal limits  URINALYSIS, ROUTINE W REFLEX MICROSCOPIC - Abnormal; Notable for the following components:   Hgb urine dipstick TRACE (*)    Ketones, ur 15 (*)    All other components within normal limits  LIPASE, BLOOD  PREGNANCY, URINE    EKG None  Radiology No results found.  Procedures Procedures    Medications Ordered in ED Medications - No data to display  ED Course/ Medical Decision Making/ A&P  Patient presenting with left-sided flank and abdominal pain as described in the HPI.  She was found to have an ovarian cyst last week.  Symptoms have since resolved at the time of presentation.  Patient arrives here with stable vital signs and physical examination is basically unremarkable.  Workup initiated including CBC, CMP, and lipase, all  of which were unremarkable.  Urinalysis positive for trace blood, but is otherwise normal.  Urine pregnancy test is negative.  Because of patient's flank and abdominal pain uncertain, however laboratory studies are basically unremarkable.  She did have a workup 2 weeks ago showing a negative CT scan.  I do not feel as though this needs to be repeated given the fact that her symptoms has resolved.  I also highly doubt other pathology such as torsion.  I will advise patient to take naproxen and follow-up with her primary doctor/GYN as needed.  Final Clinical Impression(s) / ED Diagnoses Final diagnoses:  None    Rx / DC Orders ED Discharge Orders     None         Geoffery Lyons, MD 02/03/23 0120

## 2023-05-03 ENCOUNTER — Emergency Department (HOSPITAL_BASED_OUTPATIENT_CLINIC_OR_DEPARTMENT_OTHER): Payer: 59

## 2023-05-03 ENCOUNTER — Other Ambulatory Visit: Payer: Self-pay

## 2023-05-03 ENCOUNTER — Inpatient Hospital Stay (HOSPITAL_BASED_OUTPATIENT_CLINIC_OR_DEPARTMENT_OTHER)
Admission: EM | Admit: 2023-05-03 | Discharge: 2023-05-05 | DRG: 418 | Disposition: A | Discharge status: Home / Self Care | Payer: 59 | Source: Ambulatory Visit | Attending: Surgery | Admitting: Surgery

## 2023-05-03 DIAGNOSIS — K8066 Calculus of gallbladder and bile duct with acute and chronic cholecystitis without obstruction: Principal | ICD-10-CM | POA: Diagnosis present

## 2023-05-03 DIAGNOSIS — K8020 Calculus of gallbladder without cholecystitis without obstruction: Principal | ICD-10-CM

## 2023-05-03 DIAGNOSIS — Z880 Allergy status to penicillin: Secondary | ICD-10-CM | POA: Diagnosis not present

## 2023-05-03 DIAGNOSIS — R7989 Other specified abnormal findings of blood chemistry: Secondary | ICD-10-CM

## 2023-05-03 DIAGNOSIS — K8050 Calculus of bile duct without cholangitis or cholecystitis without obstruction: Secondary | ICD-10-CM | POA: Diagnosis present

## 2023-05-03 DIAGNOSIS — Y838 Other surgical procedures as the cause of abnormal reaction of the patient, or of later complication, without mention of misadventure at the time of the procedure: Secondary | ICD-10-CM | POA: Diagnosis not present

## 2023-05-03 DIAGNOSIS — T80818A Extravasation of other vesicant agent, initial encounter: Secondary | ICD-10-CM | POA: Diagnosis not present

## 2023-05-03 DIAGNOSIS — N83202 Unspecified ovarian cyst, left side: Secondary | ICD-10-CM | POA: Diagnosis present

## 2023-05-03 DIAGNOSIS — K819 Cholecystitis, unspecified: Secondary | ICD-10-CM | POA: Diagnosis not present

## 2023-05-03 LAB — COMPREHENSIVE METABOLIC PANEL
ALT: 304 U/L — ABNORMAL HIGH (ref 0–44)
AST: 368 U/L — ABNORMAL HIGH (ref 15–41)
Albumin: 4.5 g/dL (ref 3.5–5.0)
Alkaline Phosphatase: 151 U/L — ABNORMAL HIGH (ref 38–126)
Anion gap: 8 (ref 5–15)
BUN: 5 mg/dL — ABNORMAL LOW (ref 6–20)
CO2: 25 mmol/L (ref 22–32)
Calcium: 9.8 mg/dL (ref 8.9–10.3)
Chloride: 102 mmol/L (ref 98–111)
Creatinine, Ser: 0.74 mg/dL (ref 0.44–1.00)
GFR, Estimated: >60 mL/min (ref >60)
Glucose, Bld: 104 mg/dL — ABNORMAL HIGH (ref 70–99)
Potassium: 3.9 mmol/L (ref 3.5–5.1)
Sodium: 135 mmol/L (ref 135–145)
Total Bilirubin: 1 mg/dL (ref <1.2)
Total Protein: 8.2 g/dL — ABNORMAL HIGH (ref 6.5–8.1)

## 2023-05-03 LAB — CBC WITH DIFFERENTIAL/PLATELET
Abs Immature Granulocytes: 0.03 10*3/uL (ref 0.00–0.07)
Basophils Absolute: 0 10*3/uL (ref 0.0–0.1)
Basophils Relative: 0 %
Eosinophils Absolute: 0 10*3/uL (ref 0.0–0.5)
Eosinophils Relative: 0 %
HCT: 36 % (ref 36.0–46.0)
Hemoglobin: 11.8 g/dL — ABNORMAL LOW (ref 12.0–15.0)
Immature Granulocytes: 0 %
Lymphocytes Relative: 13 %
Lymphs Abs: 1.1 10*3/uL (ref 0.7–4.0)
MCH: 29.9 pg (ref 26.0–34.0)
MCHC: 32.8 g/dL (ref 30.0–36.0)
MCV: 91.1 fL (ref 80.0–100.0)
Monocytes Absolute: 0.6 10*3/uL (ref 0.1–1.0)
Monocytes Relative: 7 %
Neutro Abs: 6.4 10*3/uL (ref 1.7–7.7)
Neutrophils Relative %: 80 %
Platelets: 169 10*3/uL (ref 150–400)
RBC: 3.95 MIL/uL (ref 3.87–5.11)
RDW: 13.6 % (ref 11.5–15.5)
WBC: 8.1 10*3/uL (ref 4.0–10.5)
nRBC: 0 % (ref 0.0–0.2)

## 2023-05-03 LAB — LIPASE, BLOOD: Lipase: 20 U/L (ref 11–51)

## 2023-05-03 LAB — HCG, SERUM, QUALITATIVE: Preg, Serum: NEGATIVE

## 2023-05-03 MED ORDER — ENOXAPARIN SODIUM 40 MG/0.4ML IJ SOSY
40.0000 mg | PREFILLED_SYRINGE | INTRAMUSCULAR | Status: DC
  Administered 2023-05-04: 40 mg via SUBCUTANEOUS

## 2023-05-03 MED ORDER — ONDANSETRON HCL 4 MG/2ML IJ SOLN
4.0000 mg | Freq: Four times a day (QID) | INTRAMUSCULAR | Status: DC | PRN
  Administered 2023-05-04 (×2): 4 mg via INTRAVENOUS
  Filled 2023-05-03 (×2): qty 2

## 2023-05-03 MED ORDER — DOCUSATE SODIUM 100 MG PO CAPS
200.0000 mg | ORAL_CAPSULE | Freq: Two times a day (BID) | ORAL | Status: DC
  Administered 2023-05-03 – 2023-05-05 (×4): 200 mg via ORAL
  Filled 2023-05-03 (×4): qty 2

## 2023-05-03 MED ORDER — ACETAMINOPHEN 325 MG PO TABS
650.0000 mg | ORAL_TABLET | Freq: Four times a day (QID) | ORAL | Status: DC
  Administered 2023-05-04 – 2023-05-05 (×5): 650 mg via ORAL
  Filled 2023-05-03 (×5): qty 2

## 2023-05-03 MED ORDER — HYDROMORPHONE HCL 1 MG/ML IJ SOLN
0.5000 mg | INTRAMUSCULAR | Status: DC | PRN
  Administered 2023-05-04: 1 mg via INTRAVENOUS
  Administered 2023-05-04 (×2): 0.5 mg via INTRAVENOUS
  Filled 2023-05-03 (×2): qty 0.5

## 2023-05-03 MED ORDER — ONDANSETRON 4 MG PO TBDP
4.0000 mg | ORAL_TABLET | Freq: Four times a day (QID) | ORAL | Status: DC | PRN
  Filled 2023-05-03: qty 1

## 2023-05-03 MED ORDER — DIPHENHYDRAMINE HCL 12.5 MG/5ML PO ELIX: 12.5000 mg | ORAL_SOLUTION | Freq: Four times a day (QID) | ORAL | Status: DC | PRN

## 2023-05-03 MED ORDER — DIPHENHYDRAMINE HCL 50 MG/ML IJ SOLN: 12.5000 mg | Freq: Four times a day (QID) | INTRAMUSCULAR | Status: DC | PRN

## 2023-05-03 MED ORDER — KCL IN DEXTROSE-NACL 20-5-0.45 MEQ/L-%-% IV SOLN
INTRAVENOUS | Status: AC
  Filled 2023-05-03 (×2): qty 1000

## 2023-05-03 MED ORDER — SIMETHICONE 80 MG PO CHEW: 40.0000 mg | CHEWABLE_TABLET | Freq: Four times a day (QID) | ORAL | Status: DC | PRN

## 2023-05-03 MED ORDER — LACTATED RINGERS IV BOLUS
1000.0000 mL | Freq: Once | INTRAVENOUS | Status: AC
  Administered 2023-05-03: 1000 mL via INTRAVENOUS

## 2023-05-03 MED ORDER — IBUPROFEN 600 MG PO TABS: 600.0000 mg | ORAL_TABLET | Freq: Four times a day (QID) | ORAL | Status: DC | PRN

## 2023-05-03 MED ORDER — FENTANYL CITRATE PF 50 MCG/ML IJ SOSY
50.0000 ug | PREFILLED_SYRINGE | Freq: Once | INTRAMUSCULAR | Status: AC
  Administered 2023-05-03: 50 ug via INTRAVENOUS
  Filled 2023-05-03: qty 1

## 2023-05-03 MED ORDER — TRAMADOL HCL 50 MG PO TABS: 50.0000 mg | ORAL_TABLET | Freq: Four times a day (QID) | ORAL | Status: DC | PRN

## 2023-05-03 MED ORDER — HYDRALAZINE HCL 20 MG/ML IJ SOLN: 10.0000 mg | INTRAMUSCULAR | Status: DC | PRN

## 2023-05-03 MED ORDER — IOHEXOL 300 MG/ML  SOLN
100.0000 mL | Freq: Once | INTRAMUSCULAR | Status: AC | PRN
  Administered 2023-05-03: 80 mL via INTRAVENOUS

## 2023-05-03 NOTE — ED Notes (Signed)
Pt informed of transport to [facility] ED... Pt was okay with being transported by POV... Pt CAOx4 upon leaving... Pain 0/10... Pt understood no drinking/eating until being seen at [facility]... Family understood no speeding and to travel at safely... Family and Pt understood no stopping for food and to travel directly to the hospital... IV secured... Pt informed of accepting MD..Marland Kitchen

## 2023-05-03 NOTE — ED Notes (Signed)
ED TO INPATIENT HANDOFF REPORT  ED Nurse Name and Phone #: [phone]  S Name/Age/Gender Kayla Ayala 37 y.o. female Room/Bed: H018C/H018C  Code Status   Code Status: Not on file  Home/SNF/Other Home Patient oriented to: self, place, time, and situation Is this baseline? Yes   Triage Complete: Triage complete  Chief Complaint Choledocholithiasis [K80.50]  Triage Note Pt via pov from UC with right flank pain today. States she was awakened out of sleep by the pain. Pt reports that UC performed urinalysis and sent her over for evaluation and possible gallbladder issues. Pt alert & oriented, nad noted.   Pt seen for the same in July & August, has followed up with OB, who says existing cyst was gone.   Allergies Allergies  Allergen Reactions   Amoxicillin Hives    Level of Care/Admitting Diagnosis ED Disposition     ED Disposition  Admit   Condition  --   Comment  Hospital Area: [facility] [[id number]]  Level of Care: Med-Surg [16]  May admit patient to [facility] or [facility] if equivalent level of care is available:: No  Covid Evaluation: Asymptomatic - no recent exposure (last 10 days) testing not required  Diagnosis: Choledocholithiasis [562130]  Admitting Physician: CCS, MD [[id number]]  Attending Physician: CCS, MD [[id number]]  Bed request comments: 6N  Certification:: I certify this patient will need inpatient services for at least 2 midnights  Expected Medical Readiness: 05/05/2023          B Medical/Surgery History No past medical history on file. Past Surgical History:  Procedure Laterality Date   TUBAL LIGATION     TYMPANOSTOMY TUBE PLACEMENT       A IV Location/Drains/Wounds Patient Lines/Drains/Airways Status     Active Line/Drains/Airways     Name Placement date Placement time Site Days   Peripheral IV 05/03/23 20 G 1" Anterior;Proximal;Right Forearm 05/03/23  1041  Forearm  less than 1            Intake/Output Last 24  hours  Intake/Output Summary (Last 24 hours) at 05/03/2023 2041 Last data filed at 05/03/2023 1343 Gross per 24 hour  Intake 1000 ml  Output --  Net 1000 ml    Labs/Imaging Results for orders placed or performed during the hospital encounter of 05/03/23 (from the past 48 hour(s))  CBC with Differential     Status: Abnormal   Collection Time: 05/03/23 10:39 AM  Result Value Ref Range   WBC 8.1 4.0 - 10.5 K/uL   RBC 3.95 3.87 - 5.11 MIL/uL   Hemoglobin 11.8 (L) 12.0 - 15.0 g/dL   HCT 86.5 78.4 - 69.6 %   MCV 91.1 80.0 - 100.0 fL   MCH 29.9 26.0 - 34.0 pg   MCHC 32.8 30.0 - 36.0 g/dL   RDW 29.5 28.4 - 13.2 %   Platelets 169 150 - 400 K/uL   nRBC 0.0 0.0 - 0.2 %   Neutrophils Relative % 80 %   Neutro Abs 6.4 1.7 - 7.7 K/uL   Lymphocytes Relative 13 %   Lymphs Abs 1.1 0.7 - 4.0 K/uL   Monocytes Relative 7 %   Monocytes Absolute 0.6 0.1 - 1.0 K/uL   Eosinophils Relative 0 %   Eosinophils Absolute 0.0 0.0 - 0.5 K/uL   Basophils Relative 0 %   Basophils Absolute 0.0 0.0 - 0.1 K/uL   Immature Granulocytes 0 %   Abs Immature Granulocytes 0.03 0.00 - 0.07 K/uL    Comment:  Performed at [facility], [address] [place], [place] [address]  Comprehensive metabolic panel     Status: Abnormal   Collection Time: 05/03/23 10:39 AM  Result Value Ref Range   Sodium 135 135 - 145 mmol/L   Potassium 3.9 3.5 - 5.1 mmol/L   Chloride 102 98 - 111 mmol/L   CO2 25 22 - 32 mmol/L   Glucose, Bld 104 (H) 70 - 99 mg/dL    Comment: Glucose reference range applies only to samples taken after fasting for at least 8 hours.   BUN 5 (L) 6 - 20 mg/dL   Creatinine, Ser 6.04 0.44 - 1.00 mg/dL   Calcium 9.8 8.9 - 54.0 mg/dL   Total Protein 8.2 (H) 6.5 - 8.1 g/dL   Albumin 4.5 3.5 - 5.0 g/dL   AST 981 (H) 15 - 41 U/L   ALT 304 (H) 0 - 44 U/L   Alkaline Phosphatase 151 (H) 38 - 126 U/L   Total Bilirubin 1.0 <1.2 mg/dL   GFR, Estimated >19 >14 mL/min    Comment:  (NOTE) Calculated using the CKD-EPI Creatinine Equation (2021)    Anion gap 8 5 - 15    Comment: Performed at [facility], [address], [place], [place] [address]  hCG, serum, qualitative     Status: None   Collection Time: 05/03/23 10:39 AM  Result Value Ref Range   Preg, Serum NEGATIVE NEGATIVE    Comment:        THE SENSITIVITY OF THIS METHODOLOGY IS >10 mIU/mL. Performed at [facility], [address], [place], [place] [address]   Lipase, blood     Status: None   Collection Time: 05/03/23 10:39 AM  Result Value Ref Range   Lipase 20 11 - 51 U/L    Comment: Performed at [facility], [address], [place], [place] [address]   CT ABDOMEN PELVIS W CONTRAST  Result Date: 05/03/2023 CLINICAL DATA:  Right flank pain. EXAM: CT ABDOMEN AND PELVIS WITH CONTRAST TECHNIQUE: Multidetector CT imaging of the abdomen and pelvis was performed using the standard protocol following bolus administration of intravenous contrast. RADIATION DOSE REDUCTION: This exam was performed according to the departmental dose-optimization program which includes automated exposure control, adjustment of the mA and/or kV according to patient size and/or use of iterative reconstruction technique. CONTRAST:  80mL OMNIPAQUE IOHEXOL 300 MG/ML  SOLN COMPARISON:  Right upper quadrant ultrasound from same day. CT abdomen pelvis dated January 16, 2023. FINDINGS: Lower chest: No acute abnormality. Hepatobiliary: No focal liver abnormality. Small gallstones. No gallbladder wall thickening or biliary dilatation. Pancreas: Unremarkable. No pancreatic ductal dilatation or surrounding inflammatory changes. Spleen: Normal in size without focal abnormality. Adrenals/Urinary Tract: Adrenal glands are unremarkable. Kidneys are normal, without renal calculi, focal lesion, or hydronephrosis. Bladder is unremarkable. Stomach/Bowel: Stomach is within normal limits. Appendix appears  normal. No evidence of bowel wall thickening, distention, or inflammatory changes. Vascular/Lymphatic: No significant vascular findings are present. No enlarged abdominal or pelvic lymph nodes. Reproductive: Uterus and bilateral adnexa are unremarkable. Left ovarian corpus luteum noted. Other: Trace free fluid inferior to the cecum, nonspecific. No pneumoperitoneum. Musculoskeletal: No acute or significant osseous findings. IMPRESSION: 1. No acute intra-abdominal process. Normal appendix. 2. Unchanged cholelithiasis. 3. Trace free fluid inferior to the cecum, nonspecific. Electronically Signed   By: Obie Dredge M.D.   On: 05/03/2023 16:38   US Abdomen Limited RUQ (LIVER/GB)  Result Date: 05/03/2023 CLINICAL DATA:  Right upper quadrant pain  EXAM: ULTRASOUND ABDOMEN LIMITED RIGHT UPPER QUADRANT COMPARISON:  CT 01/16/2023. FINDINGS: Gallbladder: Distended gallbladder with layering dependent stones. No wall thickening or adjacent fluid. No reported sonographic Murphy's sign. Common bile duct: Diameter: 5 mm Liver: No focal lesion identified. Within normal limits in parenchymal echogenicity. Portal vein is patent on color Doppler imaging with normal direction of blood flow towards the liver. Other: Question subtle areas of free fluid. IMPRESSION: Distended gallbladder with stones. No ductal dilatation. No further sonographic evidence of acute cholecystitis. Question subtle areas of free fluid in the abdomen. Please correlate with clinical presentation and if needed further workup with CT as clinically appropriate Electronically Signed   By: Karen Kays M.D.   On: 05/03/2023 13:19    Pending Labs [employer] (From admission, onward)     Start     Ordered   Signed and Held  HIV Antibody (routine testing w rflx)  (HIV Antibody (Routine testing w reflex) panel)  Once,   R        Signed and Held   Signed and Held  Creatinine, serum  (enoxaparin (LOVENOX)    CrCl >/= 30 ml/min)  Weekly,   R     Comments:  while on enoxaparin therapy    Signed and Held   Signed and Held  Comprehensive metabolic panel  Tomorrow morning,   R        Signed and Held   Signed and Held  CBC WITH DIFFERENTIAL  Tomorrow morning,   R        Signed and Held            Vitals/Pain Today's Vitals   05/03/23 1330 05/03/23 1500 05/03/23 1651 05/03/23 2040  BP: (!) 101/51 (!) 97/57 (!) 133/90 120/74  Pulse: 81 81 88 80  Resp: 16 16 16 14   Temp: 98.2 F (36.8 C)   98.6 F (37 C)  TempSrc: Oral   Oral  SpO2: 100% 100% 100% 100%  Weight:      Height:      PainSc:        Isolation Precautions No active isolations  Medications Medications  fentaNYL (SUBLIMAZE) injection 50 mcg (50 mcg Intravenous Given 05/03/23 1041)  lactated ringers bolus 1,000 mL (0 mLs Intravenous Stopped 05/03/23 1343)  iohexol (OMNIPAQUE) 300 MG/ML solution 100 mL (80 mLs Intravenous Contrast Given 05/03/23 1341)    Mobility walks     Focused Assessments     R Recommendations: See Admitting Provider Note  Report given to:   Additional Notes: Pt A/Ox4, pt ambulatory.

## 2023-05-03 NOTE — ED Provider Notes (Signed)
Patient transferred from [facility] ED. Currently without pain. Dr. Cliffton Asters of general surgery has seen patient, they will admit for monitoring and repeat LFTs.   Pricilla Loveless, MD 05/03/23 2055

## 2023-05-03 NOTE — ED Triage Notes (Signed)
Pt via pov from UC with right flank pain today. States she was awakened out of sleep by the pain. Pt reports that UC performed urinalysis and sent her over for evaluation and possible gallbladder issues. Pt alert & oriented, nad noted.   Pt seen for the same in July & August, has followed up with OB, who says existing cyst was gone.

## 2023-05-03 NOTE — H&P (Signed)
CC: RUQ pain, possible choledocholithiasis  HPI: Kayla Ayala is an 37 y.o. female with no reported medical history presented to [facility] with right upper quadrant abdominal pain.  This woke her from sleep last night.  Pain radiated into her right back.  Associated nausea; emesis x1.  She was initially seen in urgent care by report and was told to go to the emergency department.  No apparent fevers.  She underwent workup in the emergency department at [facility] and was found to have gallstones with elevated liver enzymes.  She is subsequently transferred to [facility] for surgical evaluation.  Reports after 100 mcg fentanyl her pain improved. Currently minimal if any RUQ pain. Reports similar episodes in past at least twice, and being evaluated for similar at ER 01/16/23. On that visit, she had CT showing gallstones but normal labs and a left ovarian cyst, could have been hemorrhagic.    No past medical history on file.  Past Surgical History:  Procedure Laterality Date   TUBAL LIGATION     TYMPANOSTOMY TUBE PLACEMENT      No family history on file.  Social:  reports that she has never smoked. She has never used smokeless tobacco. She reports that she does not currently use alcohol. She reports that she does not use drugs.  Allergies:  Allergies  Allergen Reactions   Amoxicillin Hives    Medications: I have reviewed the patient's current medications.  Results for orders placed or performed during the hospital encounter of 05/03/23 (from the past 48 hour(s))  CBC with Differential     Status: Abnormal   Collection Time: 05/03/23 10:39 AM  Result Value Ref Range   WBC 8.1 4.0 - 10.5 K/uL   RBC 3.95 3.87 - 5.11 MIL/uL   Hemoglobin 11.8 (L) 12.0 - 15.0 g/dL   HCT 40.9 81.1 - 91.4 %   MCV 91.1 80.0 - 100.0 fL   MCH 29.9 26.0 - 34.0 pg   MCHC 32.8 30.0 - 36.0 g/dL   RDW 78.2 95.6 - 21.3 %   Platelets 169 150 - 400 K/uL   nRBC 0.0 0.0 - 0.2 %   Neutrophils  Relative % 80 %   Neutro Abs 6.4 1.7 - 7.7 K/uL   Lymphocytes Relative 13 %   Lymphs Abs 1.1 0.7 - 4.0 K/uL   Monocytes Relative 7 %   Monocytes Absolute 0.6 0.1 - 1.0 K/uL   Eosinophils Relative 0 %   Eosinophils Absolute 0.0 0.0 - 0.5 K/uL   Basophils Relative 0 %   Basophils Absolute 0.0 0.0 - 0.1 K/uL   Immature Granulocytes 0 %   Abs Immature Granulocytes 0.03 0.00 - 0.07 K/uL    Comment: Performed at [facility], [address] [place], [place] [address]  Comprehensive metabolic panel     Status: Abnormal   Collection Time: 05/03/23 10:39 AM  Result Value Ref Range   Sodium 135 135 - 145 mmol/L   Potassium 3.9 3.5 - 5.1 mmol/L   Chloride 102 98 - 111 mmol/L   CO2 25 22 - 32 mmol/L   Glucose, Bld 104 (H) 70 - 99 mg/dL    Comment: Glucose reference range applies only to samples taken after fasting for at least 8 hours.   BUN 5 (L) 6 - 20 mg/dL   Creatinine, Ser 8.46 0.44 - 1.00 mg/dL   Calcium 9.8 8.9 - 96.2 mg/dL   Total Protein 8.2 (H) 6.5 - 8.1 g/dL   Albumin  4.5 3.5 - 5.0 g/dL   AST 284 (H) 15 - 41 U/L   ALT 304 (H) 0 - 44 U/L   Alkaline Phosphatase 151 (H) 38 - 126 U/L   Total Bilirubin 1.0 <1.2 mg/dL   GFR, Estimated >13 >24 mL/min    Comment: (NOTE) Calculated using the CKD-EPI Creatinine Equation (2021)    Anion gap 8 5 - 15    Comment: Performed at [facility], [address], [place], [place] [address]  hCG, serum, qualitative     Status: None   Collection Time: 05/03/23 10:39 AM  Result Value Ref Range   Preg, Serum NEGATIVE NEGATIVE    Comment:        THE SENSITIVITY OF THIS METHODOLOGY IS >10 mIU/mL. Performed at [facility], [address] [place], [place] [address]   Lipase, blood     Status: None   Collection Time: 05/03/23 10:39 AM  Result Value Ref Range   Lipase 20 11 - 51 U/L    Comment: Performed at [facility], [address], [place], [place] [address]     CT ABDOMEN PELVIS W CONTRAST  Result Date: 05/03/2023 CLINICAL DATA:  Right flank pain. EXAM: CT ABDOMEN AND PELVIS WITH CONTRAST TECHNIQUE: Multidetector CT imaging of the abdomen and pelvis was performed using the standard protocol following bolus administration of intravenous contrast. RADIATION DOSE REDUCTION: This exam was performed according to the departmental dose-optimization program which includes automated exposure control, adjustment of the mA and/or kV according to patient size and/or use of iterative reconstruction technique. CONTRAST:  80mL OMNIPAQUE IOHEXOL 300 MG/ML  SOLN COMPARISON:  Right upper quadrant ultrasound from same day. CT abdomen pelvis dated January 16, 2023. FINDINGS: Lower chest: No acute abnormality. Hepatobiliary: No focal liver abnormality. Small gallstones. No gallbladder wall thickening or biliary dilatation. Pancreas: Unremarkable. No pancreatic ductal dilatation or surrounding inflammatory changes. Spleen: Normal in size without focal abnormality. Adrenals/Urinary Tract: Adrenal glands are unremarkable. Kidneys are normal, without renal calculi, focal lesion, or hydronephrosis. Bladder is unremarkable. Stomach/Bowel: Stomach is within normal limits. Appendix appears normal. No evidence of bowel wall thickening, distention, or inflammatory changes. Vascular/Lymphatic: No significant vascular findings are present. No enlarged abdominal or pelvic lymph nodes. Reproductive: Uterus and bilateral adnexa are unremarkable. Left ovarian corpus luteum noted. Other: Trace free fluid inferior to the cecum, nonspecific. No pneumoperitoneum. Musculoskeletal: No acute or significant osseous findings. IMPRESSION: 1. No acute intra-abdominal process. Normal appendix. 2. Unchanged cholelithiasis. 3. Trace free fluid inferior to the cecum, nonspecific. Electronically Signed   By: Obie Dredge M.D.   On: 05/03/2023 16:38   US Abdomen Limited RUQ (LIVER/GB)  Result Date:  05/03/2023 CLINICAL DATA:  Right upper quadrant pain EXAM: ULTRASOUND ABDOMEN LIMITED RIGHT UPPER QUADRANT COMPARISON:  CT 01/16/2023. FINDINGS: Gallbladder: Distended gallbladder with layering dependent stones. No wall thickening or adjacent fluid. No reported sonographic Murphy's sign. Common bile duct: Diameter: 5 mm Liver: No focal lesion identified. Within normal limits in parenchymal echogenicity. Portal vein is patent on color Doppler imaging with normal direction of blood flow towards the liver. Other: Question subtle areas of free fluid. IMPRESSION: Distended gallbladder with stones. No ductal dilatation. No further sonographic evidence of acute cholecystitis. Question subtle areas of free fluid in the abdomen. Please correlate with clinical presentation and if needed further workup with CT as clinically appropriate Electronically Signed   By: Karen Kays M.D.   On: 05/03/2023 13:19    ROS - all of the below  systems have been reviewed with the patient and positives are indicated with bold text General: chills, fever or night sweats Eyes: blurry vision or double vision ENT: epistaxis or sore throat Allergy/Immunology: itchy/watery eyes or nasal congestion Hematologic/Lymphatic: bleeding problems, blood clots or swollen lymph nodes Endocrine: temperature intolerance or unexpected weight changes Breast: new or changing breast lumps or nipple discharge Resp: cough, shortness of breath, or wheezing CV: chest pain or dyspnea on exertion GI: as per HPI GU: dysuria, trouble voiding, or hematuria MSK: joint pain or joint stiffness Neuro: TIA or stroke symptoms Derm: pruritus and skin lesion changes Psych: anxiety and depression  PE Blood pressure (!) 133/90, pulse 88, temperature 98.2 F (36.8 C), temperature source Oral, resp. rate 16, height 5\' 3"  (1.6 m), weight 72.6 kg, last menstrual period 04/11/2023, SpO2 100%, unknown if currently breastfeeding. Constitutional: NAD; conversant Eyes:  Moist conjunctiva; no lid lag; anicteric Lungs: Normal respiratory effort CV: RRR; no palpable thrills; no pitting edema GI: Abd soft, nontender, nondistended; negative Murphy's; no palpable hepatosplenomegaly Psychiatric: Appropriate affect  Results for orders placed or performed during the hospital encounter of 05/03/23 (from the past 48 hour(s))  CBC with Differential     Status: Abnormal   Collection Time: 05/03/23 10:39 AM  Result Value Ref Range   WBC 8.1 4.0 - 10.5 K/uL   RBC 3.95 3.87 - 5.11 MIL/uL   Hemoglobin 11.8 (L) 12.0 - 15.0 g/dL   HCT 65.7 84.6 - 96.2 %   MCV 91.1 80.0 - 100.0 fL   MCH 29.9 26.0 - 34.0 pg   MCHC 32.8 30.0 - 36.0 g/dL   RDW 95.2 84.1 - 32.4 %   Platelets 169 150 - 400 K/uL   nRBC 0.0 0.0 - 0.2 %   Neutrophils Relative % 80 %   Neutro Abs 6.4 1.7 - 7.7 K/uL   Lymphocytes Relative 13 %   Lymphs Abs 1.1 0.7 - 4.0 K/uL   Monocytes Relative 7 %   Monocytes Absolute 0.6 0.1 - 1.0 K/uL   Eosinophils Relative 0 %   Eosinophils Absolute 0.0 0.0 - 0.5 K/uL   Basophils Relative 0 %   Basophils Absolute 0.0 0.0 - 0.1 K/uL   Immature Granulocytes 0 %   Abs Immature Granulocytes 0.03 0.00 - 0.07 K/uL    Comment: Performed at [facility], [address] [place], [place] [address]  Comprehensive metabolic panel     Status: Abnormal   Collection Time: 05/03/23 10:39 AM  Result Value Ref Range   Sodium 135 135 - 145 mmol/L   Potassium 3.9 3.5 - 5.1 mmol/L   Chloride 102 98 - 111 mmol/L   CO2 25 22 - 32 mmol/L   Glucose, Bld 104 (H) 70 - 99 mg/dL    Comment: Glucose reference range applies only to samples taken after fasting for at least 8 hours.   BUN 5 (L) 6 - 20 mg/dL   Creatinine, Ser 7.25 0.44 - 1.00 mg/dL   Calcium 9.8 8.9 - 36.6 mg/dL   Total Protein 8.2 (H) 6.5 - 8.1 g/dL   Albumin 4.5 3.5 - 5.0 g/dL   AST 440 (H) 15 - 41 U/L   ALT 304 (H) 0 - 44 U/L   Alkaline Phosphatase 151 (H) 38 - 126 U/L   Total Bilirubin 1.0 <1.2  mg/dL   GFR, Estimated >34 >74 mL/min    Comment: (NOTE) Calculated using the CKD-EPI Creatinine Equation (2021)    Anion gap 8 5 - 15  Comment: Performed at [facility], [address] [place], [place] [address]  hCG, serum, qualitative     Status: None   Collection Time: 05/03/23 10:39 AM  Result Value Ref Range   Preg, Serum NEGATIVE NEGATIVE    Comment:        THE SENSITIVITY OF THIS METHODOLOGY IS >10 mIU/mL. Performed at [facility], [address], [place], [place] [address]   Lipase, blood     Status: None   Collection Time: 05/03/23 10:39 AM  Result Value Ref Range   Lipase 20 11 - 51 U/L    Comment: Performed at [facility], [address] [place], [place] [address]    CT ABDOMEN PELVIS W CONTRAST  Result Date: 05/03/2023 CLINICAL DATA:  Right flank pain. EXAM: CT ABDOMEN AND PELVIS WITH CONTRAST TECHNIQUE: Multidetector CT imaging of the abdomen and pelvis was performed using the standard protocol following bolus administration of intravenous contrast. RADIATION DOSE REDUCTION: This exam was performed according to the departmental dose-optimization program which includes automated exposure control, adjustment of the mA and/or kV according to patient size and/or use of iterative reconstruction technique. CONTRAST:  80mL OMNIPAQUE IOHEXOL 300 MG/ML  SOLN COMPARISON:  Right upper quadrant ultrasound from same day. CT abdomen pelvis dated January 16, 2023. FINDINGS: Lower chest: No acute abnormality. Hepatobiliary: No focal liver abnormality. Small gallstones. No gallbladder wall thickening or biliary dilatation. Pancreas: Unremarkable. No pancreatic ductal dilatation or surrounding inflammatory changes. Spleen: Normal in size without focal abnormality. Adrenals/Urinary Tract: Adrenal glands are unremarkable. Kidneys are normal, without renal calculi, focal lesion, or hydronephrosis. Bladder is unremarkable.  Stomach/Bowel: Stomach is within normal limits. Appendix appears normal. No evidence of bowel wall thickening, distention, or inflammatory changes. Vascular/Lymphatic: No significant vascular findings are present. No enlarged abdominal or pelvic lymph nodes. Reproductive: Uterus and bilateral adnexa are unremarkable. Left ovarian corpus luteum noted. Other: Trace free fluid inferior to the cecum, nonspecific. No pneumoperitoneum. Musculoskeletal: No acute or significant osseous findings. IMPRESSION: 1. No acute intra-abdominal process. Normal appendix. 2. Unchanged cholelithiasis. 3. Trace free fluid inferior to the cecum, nonspecific. Electronically Signed   By: Obie Dredge M.D.   On: 05/03/2023 16:38   US Abdomen Limited RUQ (LIVER/GB)  Result Date: 05/03/2023 CLINICAL DATA:  Right upper quadrant pain EXAM: ULTRASOUND ABDOMEN LIMITED RIGHT UPPER QUADRANT COMPARISON:  CT 01/16/2023. FINDINGS: Gallbladder: Distended gallbladder with layering dependent stones. No wall thickening or adjacent fluid. No reported sonographic Murphy's sign. Common bile duct: Diameter: 5 mm Liver: No focal lesion identified. Within normal limits in parenchymal echogenicity. Portal vein is patent on color Doppler imaging with normal direction of blood flow towards the liver. Other: Question subtle areas of free fluid. IMPRESSION: Distended gallbladder with stones. No ductal dilatation. No further sonographic evidence of acute cholecystitis. Question subtle areas of free fluid in the abdomen. Please correlate with clinical presentation and if needed further workup with CT as clinically appropriate Electronically Signed   By: Karen Kays M.D.   On: 05/03/2023 13:19    A/P: Kayla Ayala is an 37 y.o. female with no reported medical hx here with possible choledocholithiasis  - Given improvement of symptoms, could have past gallstone; unclear as given fentanyl - Recommended admission, monitoring, repeating LFT in AM. If down  trending, discussed possible cholecystectomy this admission. If bilirubin increasing, discussed other potential interventions or tests may be necessary  I spent a total of 55 minutes in both face-to-face and non-face-to-face activities, excluding procedures performed,  for this visit on the date of this encounter.  Marin Olp, MD [facility], [facility]

## 2023-05-03 NOTE — ED Notes (Signed)
Report given to the Charge at [facility] ED. ?

## 2023-05-03 NOTE — ED Provider Notes (Signed)
[place] EMERGENCY DEPARTMENT AT [facility] Provider Note   CSN: [id number] Arrival date & time: 05/03/23  8657     History  Chief Complaint  Patient presents with   Flank Pain    Kayla Ayala is a 37 y.o. female.   Flank Pain Associated symptoms include abdominal pain.     37 year old female presenting to the emergency department with a chief complaint of right upper quadrant pain.  The patient states that she woke up from sleep with pain in her right upper quadrant that radiates to her back with associated nausea.  She was initially seen at urgent care earlier today and was sent to the emergency department due to concern for her gallbladder.  She endorses chills, denies any fevers, is not actively vomiting.  She initially rated the pain as 8 out of 10 in severity and has eased off some but is still 6 out of 10 in severity.  She received Zofran at urgent care and is no longer nauseated.  She has had previous gallstones noted on CT imaging as of July 2024.  Home Medications Prior to Admission medications   Medication Sig Start Date End Date Taking? Authorizing Provider  naproxen (NAPROSYN) 500 MG tablet Take 1 tablet (500 mg total) by mouth 2 (two) times daily. 02/03/23   Geoffery Lyons, MD  polyethylene glycol powder (MIRALAX) 17 GM/SCOOP powder Take 17 g by mouth daily. 07/24/21   Achille Rich, PA-C  Prenatal Vit-Fe Fumarate-FA (PRENATAL MULTIVITAMIN) TABS tablet Take 1 tablet by mouth daily at 12 noon.    [provider]      Allergies    Amoxicillin    Review of Systems   Review of Systems  Constitutional:  Positive for chills.  Gastrointestinal:  Positive for abdominal pain and nausea.  All other systems reviewed and are negative.   Physical Exam Updated Vital Signs BP (!) 97/57 (BP Location: Right Arm)   Pulse 81   Temp 98.2 F (36.8 C) (Oral)   Resp 16   Ht 5\' 3"  (1.6 m)   Wt 72.6 kg   LMP 04/11/2023 (Exact Date)   SpO2 100%   BMI 28.35  kg/m  Physical Exam Vitals and nursing note reviewed.  Constitutional:      General: She is not in acute distress.    Appearance: She is well-developed.  HENT:     Head: Normocephalic and atraumatic.  Eyes:     Conjunctiva/sclera: Conjunctivae normal.  Cardiovascular:     Rate and Rhythm: Normal rate and regular rhythm.     Heart sounds: No murmur heard. Pulmonary:     Effort: Pulmonary effort is normal. No respiratory distress.     Breath sounds: Normal breath sounds.  Abdominal:     Palpations: Abdomen is soft.     Tenderness: There is abdominal tenderness in the right upper quadrant. There is guarding. There is no rebound. Negative signs include Murphy's sign.  Musculoskeletal:        General: No swelling.     Cervical back: Neck supple.  Skin:    General: Skin is warm and dry.     Capillary Refill: Capillary refill takes less than 2 seconds.  Neurological:     Mental Status: She is alert.  Psychiatric:        Mood and Affect: Mood normal.     ED Results / Procedures / Treatments   Labs (all labs ordered are listed, but only abnormal results are displayed) Labs Reviewed  CBC WITH DIFFERENTIAL/PLATELET - Abnormal; Notable for the following components:      Result Value   Hemoglobin 11.8 (*)    All other components within normal limits  COMPREHENSIVE METABOLIC PANEL - Abnormal; Notable for the following components:   Glucose, Bld 104 (*)    BUN 5 (*)    Total Protein 8.2 (*)    AST 368 (*)    ALT 304 (*)    Alkaline Phosphatase 151 (*)    All other components within normal limits  HCG, SERUM, QUALITATIVE  LIPASE, BLOOD    EKG None  Radiology US Abdomen Limited RUQ (LIVER/GB)  Result Date: 05/03/2023 CLINICAL DATA:  Right upper quadrant pain EXAM: ULTRASOUND ABDOMEN LIMITED RIGHT UPPER QUADRANT COMPARISON:  CT 01/16/2023. FINDINGS: Gallbladder: Distended gallbladder with layering dependent stones. No wall thickening or adjacent fluid. No reported  sonographic Murphy's sign. Common bile duct: Diameter: 5 mm Liver: No focal lesion identified. Within normal limits in parenchymal echogenicity. Portal vein is patent on color Doppler imaging with normal direction of blood flow towards the liver. Other: Question subtle areas of free fluid. IMPRESSION: Distended gallbladder with stones. No ductal dilatation. No further sonographic evidence of acute cholecystitis. Question subtle areas of free fluid in the abdomen. Please correlate with clinical presentation and if needed further workup with CT as clinically appropriate Electronically Signed   By: Karen Kays M.D.   On: 05/03/2023 13:19    Procedures Procedures    Medications Ordered in ED Medications  fentaNYL (SUBLIMAZE) injection 50 mcg (50 mcg Intravenous Given 05/03/23 1041)  lactated ringers bolus 1,000 mL (0 mLs Intravenous Stopped 05/03/23 1343)  iohexol (OMNIPAQUE) 300 MG/ML solution 100 mL (80 mLs Intravenous Contrast Given 05/03/23 1341)    ED Course/ Medical Decision Making/ A&P                                 Medical Decision Making Amount and/or Complexity of Data Reviewed Labs: ordered. Radiology: ordered.  Risk Prescription drug management.    37 year old female presenting to the emergency department with a chief complaint of right upper quadrant pain.  The patient states that she woke up from sleep with pain in her right upper quadrant that radiates to her back with associated nausea.  She was initially seen at urgent care earlier today and was sent to the emergency department due to concern for her gallbladder.  She endorses chills, denies any fevers, is not actively vomiting.  She initially rated the pain as 8 out of 10 in severity and has eased off some but is still 6 out of 10 in severity.  She received Zofran at urgent care and is no longer nauseated.  She has had previous gallstones noted on CT imaging as of July 2024.  On arrival, the patient was afebrile, not  tachycardic heart rate 96, not tachypneic, BP 122/80, saturating her percent on room air.  Sinus rhythm noted on cardiac telemetry.  Physical exam revealed right upper quadrant tenderness to palpation with guarding, negative Murphy sign.  Concern for cholecystitis, symptomatic cholelithiasis, choledocholithiasis, pancreatitis, less likely bowel obstruction or other acute intra-abdominal emergency.  The access was obtained the patient was administered IV fentanyl and an IV fluid bolus.  A right upper quadrant ultrasound was ordered.  Laboratory evaluation revealed hCG negative, lipase normal, CBC without a leukocytosis, mild anemia to 11.8, CMP with elevated LFTs with an AST of 368, ALT of 304,  T. bili normal at 1.0.  The patient was administered IV fentanyl in addition to an IV fluid bolus for volume resuscitation.  Right upper quadrant ultrasound: IMPRESSION:  Distended gallbladder with stones. No ductal dilatation. No further  sonographic evidence of acute cholecystitis.    Question subtle areas of free fluid in the abdomen. Please correlate  with clinical presentation and if needed further workup with CT as  clinically appropriate    Questionable free fluid in the abdomen, CT abdomen pelvis was ordered to further evaluate.  Plan to follow-up results of CT imaging, engage surgery given the finding of gallstones with likely symptomatic cholelithiasis.  Signout given to Dr. Jacqulyn Bath pending CT results, likely surgical consultation and reassessment.     Final Clinical Impression(s) / ED Diagnoses Final diagnoses:  Gallstones  Symptomatic cholelithiasis  Elevated LFTs    Rx / DC Orders ED Discharge Orders     None         Ernie Avena, MD 05/03/23 1521

## 2023-05-03 NOTE — ED Provider Notes (Signed)
Blood pressure (!) 133/90, pulse 88, temperature 98.2 F (36.8 C), temperature source Oral, resp. rate 16, height 5\' 3"  (1.6 m), weight 72.6 kg, last menstrual period 04/11/2023, SpO2 100%, unknown if currently breastfeeding.  Assuming care from Dr. Karene Fry.  In short, Kayla Ayala is a 37 y.o. female with a chief complaint of Flank Pain .  Refer to the original H&P for additional details.  The current plan of care is to follow up on CT.  Spoke with Dr. Cliffton Asters with general surgery.  Recommends ED to ED transfer to [facility] where they can evaluate and decide on further management.  Dr. Gwenlyn Fudge contacted and accepts the patient in transfer to [facility].  Updated the patient on the plan.  Her husband is at bedside and can drive.  Discussed remaining n.p.o., driving directly to [facility], obeying all traffic laws. Patient appears stable for POV transport.     Maia Plan, MD 05/03/23 [phone]

## 2023-05-04 ENCOUNTER — Encounter (HOSPITAL_COMMUNITY): Admission: EM | Disposition: A | Discharge status: Home / Self Care | Payer: Self-pay | Source: Ambulatory Visit

## 2023-05-04 ENCOUNTER — Inpatient Hospital Stay (HOSPITAL_COMMUNITY): Payer: 59 | Admitting: Certified Registered"

## 2023-05-04 ENCOUNTER — Inpatient Hospital Stay (HOSPITAL_COMMUNITY): Payer: 59

## 2023-05-04 ENCOUNTER — Encounter (HOSPITAL_COMMUNITY): Payer: Self-pay

## 2023-05-04 ENCOUNTER — Other Ambulatory Visit: Payer: Self-pay

## 2023-05-04 DIAGNOSIS — K819 Cholecystitis, unspecified: Secondary | ICD-10-CM

## 2023-05-04 HISTORY — PX: CHOLECYSTECTOMY: SHX55

## 2023-05-04 LAB — COMPREHENSIVE METABOLIC PANEL
ALT: 348 U/L — ABNORMAL HIGH (ref 0–44)
AST: 286 U/L — ABNORMAL HIGH (ref 15–41)
Albumin: 3.7 g/dL (ref 3.5–5.0)
Alkaline Phosphatase: 146 U/L — ABNORMAL HIGH (ref 38–126)
Anion gap: 7 (ref 5–15)
BUN: <5 mg/dL — ABNORMAL LOW (ref 6–20)
CO2: 22 mmol/L (ref 22–32)
Calcium: 8.9 mg/dL (ref 8.9–10.3)
Chloride: 109 mmol/L (ref 98–111)
Creatinine, Ser: 0.77 mg/dL (ref 0.44–1.00)
GFR, Estimated: >60 mL/min (ref >60)
Glucose, Bld: 91 mg/dL (ref 70–99)
Potassium: 4 mmol/L (ref 3.5–5.1)
Sodium: 138 mmol/L (ref 135–145)
Total Bilirubin: 1.9 mg/dL — ABNORMAL HIGH (ref <1.2)
Total Protein: 7 g/dL (ref 6.5–8.1)

## 2023-05-04 LAB — CBC WITH DIFFERENTIAL/PLATELET
Abs Immature Granulocytes: 0.02 10*3/uL (ref 0.00–0.07)
Basophils Absolute: 0 10*3/uL (ref 0.0–0.1)
Basophils Relative: 0 %
Eosinophils Absolute: 0.1 10*3/uL (ref 0.0–0.5)
Eosinophils Relative: 2 %
HCT: 36.5 % (ref 36.0–46.0)
Hemoglobin: 11.8 g/dL — ABNORMAL LOW (ref 12.0–15.0)
Immature Granulocytes: 0 %
Lymphocytes Relative: 34 %
Lymphs Abs: 2 10*3/uL (ref 0.7–4.0)
MCH: 29.9 pg (ref 26.0–34.0)
MCHC: 32.3 g/dL (ref 30.0–36.0)
MCV: 92.6 fL (ref 80.0–100.0)
Monocytes Absolute: 0.5 10*3/uL (ref 0.1–1.0)
Monocytes Relative: 9 %
Neutro Abs: 3.2 10*3/uL (ref 1.7–7.7)
Neutrophils Relative %: 55 %
Platelets: 162 10*3/uL (ref 150–400)
RBC: 3.94 MIL/uL (ref 3.87–5.11)
RDW: 13.8 % (ref 11.5–15.5)
WBC: 5.8 10*3/uL (ref 4.0–10.5)
nRBC: 0 % (ref 0.0–0.2)

## 2023-05-04 LAB — SURGICAL PCR SCREEN
MRSA, PCR: NEGATIVE
Staphylococcus aureus: NEGATIVE

## 2023-05-04 LAB — HIV ANTIBODY (ROUTINE TESTING W REFLEX): HIV Screen 4th Generation wRfx: NONREACTIVE

## 2023-05-04 SURGERY — LAPAROSCOPIC CHOLECYSTECTOMY WITH INTRAOPERATIVE CHOLANGIOGRAM
Anesthesia: General

## 2023-05-04 MED ORDER — BUPIVACAINE-EPINEPHRINE 0.25% -1:200000 IJ SOLN
INTRAMUSCULAR | Status: DC | PRN
  Administered 2023-05-04: 9 mL

## 2023-05-04 MED ORDER — FENTANYL CITRATE (PF) 100 MCG/2ML IJ SOLN: 25.0000 ug | INTRAMUSCULAR | Status: DC | PRN

## 2023-05-04 MED ORDER — ONDANSETRON HCL 4 MG/2ML IJ SOLN
INTRAMUSCULAR | Status: AC
Start: 2023-05-04 — End: ?
  Filled 2023-05-04: qty 2

## 2023-05-04 MED ORDER — LIDOCAINE 2% (20 MG/ML) 5 ML SYRINGE
INTRAMUSCULAR | Status: DC | PRN
  Administered 2023-05-04: 60 mg via INTRAVENOUS

## 2023-05-04 MED ORDER — CHLORHEXIDINE GLUCONATE 0.12 % MT SOLN
OROMUCOSAL | Status: AC
  Administered 2023-05-04: 15 mL via OROMUCOSAL
  Filled 2023-05-04: qty 15

## 2023-05-04 MED ORDER — HYDROMORPHONE HCL 1 MG/ML IJ SOLN
INTRAMUSCULAR | Status: AC
  Filled 2023-05-04: qty 1

## 2023-05-04 MED ORDER — MIDAZOLAM HCL 2 MG/2ML IJ SOLN
INTRAMUSCULAR | Status: DC | PRN
  Administered 2023-05-04: 2 mg via INTRAVENOUS

## 2023-05-04 MED ORDER — DEXMEDETOMIDINE HCL IN NACL 80 MCG/20ML IV SOLN
INTRAVENOUS | Status: DC | PRN
  Administered 2023-05-04: 8 ug via INTRAVENOUS
  Administered 2023-05-04: 12 ug via INTRAVENOUS

## 2023-05-04 MED ORDER — PROPOFOL 10 MG/ML IV BOLUS
INTRAVENOUS | Status: DC | PRN
  Administered 2023-05-04: 200 mg via INTRAVENOUS

## 2023-05-04 MED ORDER — OXYCODONE HCL 5 MG PO TABS
5.0000 mg | ORAL_TABLET | Freq: Four times a day (QID) | ORAL | Status: DC | PRN
  Administered 2023-05-04 – 2023-05-05 (×3): 5 mg via ORAL
  Filled 2023-05-04 (×3): qty 1

## 2023-05-04 MED ORDER — INDOCYANINE GREEN 25 MG IV SOLR
7.5000 mg | Freq: Once | INTRAVENOUS | Status: AC
  Administered 2023-05-04: 7.5 mg via INTRAVENOUS

## 2023-05-04 MED ORDER — FENTANYL CITRATE (PF) 250 MCG/5ML IJ SOLN
INTRAMUSCULAR | Status: DC | PRN
  Administered 2023-05-04 (×2): 100 ug via INTRAVENOUS
  Administered 2023-05-04: 50 ug via INTRAVENOUS

## 2023-05-04 MED ORDER — OXYCODONE HCL 5 MG PO TABS: 5.0000 mg | ORAL_TABLET | Freq: Once | ORAL | Status: DC | PRN

## 2023-05-04 MED ORDER — OXYCODONE HCL 5 MG/5ML PO SOLN
5.0000 mg | Freq: Once | ORAL | Status: DC | PRN
Start: 2023-05-04 — End: 2023-05-04

## 2023-05-04 MED ORDER — SODIUM CHLORIDE 0.9 % IR SOLN
Status: DC | PRN
  Administered 2023-05-04: 1000 mL

## 2023-05-04 MED ORDER — MIDAZOLAM HCL 2 MG/2ML IJ SOLN
INTRAMUSCULAR | Status: AC
  Filled 2023-05-04: qty 2

## 2023-05-04 MED ORDER — CHLORHEXIDINE GLUCONATE CLOTH 2 % EX PADS
6.0000 | MEDICATED_PAD | Freq: Once | CUTANEOUS | Status: AC
Start: 2023-05-04 — End: 2023-05-04
  Administered 2023-05-04: 6 via TOPICAL

## 2023-05-04 MED ORDER — ONDANSETRON HCL 4 MG/2ML IJ SOLN: 4.0000 mg | Freq: Four times a day (QID) | INTRAMUSCULAR | Status: DC | PRN

## 2023-05-04 MED ORDER — SODIUM CHLORIDE 0.9 % IV SOLN
INTRAVENOUS | Status: DC | PRN
  Administered 2023-05-04: 7 mL

## 2023-05-04 MED ORDER — ROCURONIUM BROMIDE 10 MG/ML (PF) SYRINGE
PREFILLED_SYRINGE | INTRAVENOUS | Status: AC
Start: 2023-05-04 — End: ?
  Filled 2023-05-04: qty 10

## 2023-05-04 MED ORDER — PROPOFOL 10 MG/ML IV BOLUS
INTRAVENOUS | Status: AC
  Filled 2023-05-04: qty 20

## 2023-05-04 MED ORDER — 0.9 % SODIUM CHLORIDE (POUR BTL) OPTIME
TOPICAL | Status: DC | PRN
  Administered 2023-05-04: 1000 mL

## 2023-05-04 MED ORDER — CHLORHEXIDINE GLUCONATE 0.12 % MT SOLN
OROMUCOSAL | Status: AC
  Filled 2023-05-04: qty 15

## 2023-05-04 MED ORDER — DEXAMETHASONE SODIUM PHOSPHATE 10 MG/ML IJ SOLN
INTRAMUSCULAR | Status: AC
Start: 2023-05-04 — End: ?
  Filled 2023-05-04: qty 1

## 2023-05-04 MED ORDER — CHLORHEXIDINE GLUCONATE 0.12 % MT SOLN: 15.0000 mL | OROMUCOSAL | Status: AC

## 2023-05-04 MED ORDER — PHENYLEPHRINE 80 MCG/ML (10ML) SYRINGE FOR IV PUSH (FOR BLOOD PRESSURE SUPPORT)
PREFILLED_SYRINGE | INTRAVENOUS | Status: AC
  Filled 2023-05-04: qty 10

## 2023-05-04 MED ORDER — FENTANYL CITRATE (PF) 250 MCG/5ML IJ SOLN
INTRAMUSCULAR | Status: AC
  Filled 2023-05-04: qty 5

## 2023-05-04 MED ORDER — SUGAMMADEX SODIUM 200 MG/2ML IV SOLN
INTRAVENOUS | Status: DC | PRN
  Administered 2023-05-04: 200 mg via INTRAVENOUS

## 2023-05-04 MED ORDER — BUPIVACAINE-EPINEPHRINE (PF) 0.25% -1:200000 IJ SOLN
INTRAMUSCULAR | Status: AC
  Filled 2023-05-04: qty 30

## 2023-05-04 MED ORDER — CEFAZOLIN SODIUM-DEXTROSE 2-3 GM-%(50ML) IV SOLR
INTRAVENOUS | Status: DC | PRN
  Administered 2023-05-04: 2 g via INTRAVENOUS

## 2023-05-04 MED ORDER — LACTATED RINGERS IV SOLN: INTRAVENOUS | Status: DC | PRN

## 2023-05-04 MED ORDER — DEXAMETHASONE SODIUM PHOSPHATE 10 MG/ML IJ SOLN
INTRAMUSCULAR | Status: DC | PRN
  Administered 2023-05-04: 10 mg via INTRAVENOUS

## 2023-05-04 MED ORDER — ROCURONIUM BROMIDE 10 MG/ML (PF) SYRINGE
PREFILLED_SYRINGE | INTRAVENOUS | Status: DC | PRN
  Administered 2023-05-04: 50 mg via INTRAVENOUS
  Administered 2023-05-04: 10 mg via INTRAVENOUS

## 2023-05-04 MED ORDER — ONDANSETRON HCL 4 MG/2ML IJ SOLN
INTRAMUSCULAR | Status: DC | PRN
  Administered 2023-05-04: 4 mg via INTRAVENOUS

## 2023-05-04 MED ORDER — LIDOCAINE 2% (20 MG/ML) 5 ML SYRINGE
INTRAMUSCULAR | Status: AC
  Filled 2023-05-04: qty 5

## 2023-05-04 MED ORDER — KETOROLAC TROMETHAMINE 30 MG/ML IJ SOLN
INTRAMUSCULAR | Status: DC | PRN
  Administered 2023-05-04: 30 mg via INTRAVENOUS

## 2023-05-04 MED ORDER — CHLORHEXIDINE GLUCONATE CLOTH 2 % EX PADS
6.0000 | MEDICATED_PAD | Freq: Once | CUTANEOUS | Status: AC
  Administered 2023-05-04: 6 via TOPICAL

## 2023-05-04 SURGICAL SUPPLY — 39 items
APPLIER CLIP ROT 10 11.4 M/L (STAPLE) ×1
BAG COUNTER SPONGE SURGICOUNT (BAG) ×1 IMPLANT
BLADE CLIPPER SURG (BLADE) IMPLANT
CANISTER SUCT 3000ML PPV (MISCELLANEOUS) ×1 IMPLANT
CHLORAPREP W/TINT 26 (MISCELLANEOUS) ×1 IMPLANT
CLIP APPLIE ROT 10 11.4 M/L (STAPLE) ×1 IMPLANT
COVER MAYO STAND STRL (DRAPES) ×1 IMPLANT
COVER SURGICAL LIGHT HANDLE (MISCELLANEOUS) ×1 IMPLANT
DERMABOND ADVANCED .7 DNX12 (GAUZE/BANDAGES/DRESSINGS) ×1 IMPLANT
DRAPE C-ARM 42X120 X-RAY (DRAPES) ×1 IMPLANT
ELECT REM PT RETURN 9FT ADLT (ELECTROSURGICAL) ×1
ELECTRODE REM PT RTRN 9FT ADLT (ELECTROSURGICAL) ×1 IMPLANT
GLOVE BIO SURGEON STRL SZ8 (GLOVE) ×1 IMPLANT
GLOVE BIOGEL PI IND STRL 8 (GLOVE) ×1 IMPLANT
GOWN STRL REUS W/ TWL LRG LVL3 (GOWN DISPOSABLE) ×2 IMPLANT
GOWN STRL REUS W/ TWL XL LVL3 (GOWN DISPOSABLE) ×1 IMPLANT
GOWN STRL REUS W/TWL LRG LVL3 (GOWN DISPOSABLE) ×2
GOWN STRL REUS W/TWL XL LVL3 (GOWN DISPOSABLE) ×1
IRRIG SUCT STRYKERFLOW 2 WTIP (MISCELLANEOUS) ×1
IRRIGATION SUCT STRKRFLW 2 WTP (MISCELLANEOUS) ×1 IMPLANT
KIT BASIN OR (CUSTOM PROCEDURE TRAY) ×1 IMPLANT
KIT TURNOVER KIT B (KITS) ×1 IMPLANT
NS IRRIG 1000ML POUR BTL (IV SOLUTION) ×1 IMPLANT
PAD ARMBOARD 7.5X6 YLW CONV (MISCELLANEOUS) ×1 IMPLANT
POUCH RETRIEVAL ECOSAC 10 (ENDOMECHANICALS) ×1 IMPLANT
SCISSORS LAP 5X35 DISP (ENDOMECHANICALS) ×1 IMPLANT
SET CHOLANGIOGRAPH 5 50 .035 (SET/KITS/TRAYS/PACK) ×1 IMPLANT
SET TUBE SMOKE EVAC HIGH FLOW (TUBING) ×1 IMPLANT
SLEEVE Z-THREAD 5X100MM (TROCAR) ×1 IMPLANT
SPECIMEN JAR SMALL (MISCELLANEOUS) ×1 IMPLANT
SUT MNCRL AB 4-0 PS2 18 (SUTURE) ×1 IMPLANT
TOWEL GREEN STERILE (TOWEL DISPOSABLE) ×1 IMPLANT
TOWEL GREEN STERILE FF (TOWEL DISPOSABLE) ×1 IMPLANT
TRAY LAPAROSCOPIC MC (CUSTOM PROCEDURE TRAY) ×1 IMPLANT
TROCAR 11X100 Z THREAD (TROCAR) ×1 IMPLANT
TROCAR BALLN 12MMX100 BLUNT (TROCAR) ×1 IMPLANT
TROCAR Z-THREAD OPTICAL 5X100M (TROCAR) ×1 IMPLANT
WARMER LAPAROSCOPE (MISCELLANEOUS) ×1 IMPLANT
WATER STERILE IRR 1000ML POUR (IV SOLUTION) ×1 IMPLANT

## 2023-05-04 NOTE — Anesthesia Preprocedure Evaluation (Signed)
Anesthesia Evaluation  Patient identified by MRN, date of birth, ID band Patient awake    Reviewed: Allergy & Precautions, H&P , NPO status , Patient's Chart, lab work & pertinent test results  Airway Mallampati: II   Neck ROM: full    Dental   Pulmonary neg pulmonary ROS   breath sounds clear to auscultation       Cardiovascular negative cardio ROS  Rhythm:regular Rate:Normal     Neuro/Psych    GI/Hepatic   Endo/Other    Renal/GU      Musculoskeletal   Abdominal   Peds  Hematology   Anesthesia Other Findings   Reproductive/Obstetrics                             Anesthesia Physical Anesthesia Plan  ASA: 1  Anesthesia Plan: General   Post-op Pain Management:    Induction: Intravenous  PONV Risk Score and Plan: 3 and Ondansetron, Dexamethasone, Midazolam and Treatment may vary due to age or medical condition  Airway Management Planned: Oral ETT  Additional Equipment:   Intra-op Plan:   Post-operative Plan:   Informed Consent: I have reviewed the patients History and Physical, chart, labs and discussed the procedure including the risks, benefits and alternatives for the proposed anesthesia with the patient or authorized representative who has indicated his/her understanding and acceptance.     Dental advisory given  Plan Discussed with: CRNA, Anesthesiologist and Surgeon  Anesthesia Plan Comments:        Anesthesia Quick Evaluation

## 2023-05-04 NOTE — Transfer of Care (Signed)
Immediate Anesthesia Transfer of Care Note  Patient: Kayla Ayala  Procedure(s) Performed: LAPAROSCOPIC CHOLECYSTECTOMY WITH INTRAOPERATIVE CHOLANGIOGRAM  Patient Location: PACU  Anesthesia Type:General  Level of Consciousness: awake, alert , and oriented  Airway & Oxygen Therapy: Patient Spontanous Breathing and Patient connected to face mask oxygen  Post-op Assessment: Report given to RN and Post -op Vital signs reviewed and stable  Post vital signs: Reviewed and stable  Last Vitals:  Vitals Value Taken Time  BP 137/77 05/04/23 1330  Temp 36.7 C 05/04/23 1325  Pulse 80 05/04/23 1332  Resp 9 05/04/23 1332  SpO2 100 % 05/04/23 1332  Vitals shown include unfiled device data.  Last Pain:  Vitals:   05/04/23 1325  TempSrc:   PainSc: 10-Worst pain ever         Complications: No notable events documented.

## 2023-05-04 NOTE — Plan of Care (Signed)
  Problem: Education: Goal: Knowledge of General Education information will improve Description: Including pain rating scale, medication(s)/side effects and non-pharmacologic comfort measures Outcome: Progressing   Problem: Clinical Measurements: Goal: Ability to maintain clinical measurements within normal limits will improve Outcome: Progressing   Problem: Activity: Goal: Risk for activity intolerance will decrease Outcome: Progressing   Problem: Coping: Goal: Level of anxiety will decrease Outcome: Progressing   Problem: Pain Management: Goal: General experience of comfort will improve Outcome: Progressing

## 2023-05-04 NOTE — H&P (View-Only) (Signed)
Day of Surgery   Subjective/Chief Complaint: Right upper quadrant pain   Objective: Vital signs in last 24 hours: Temp:  [98.1 F (36.7 C)-99.2 F (37.3 C)] 98.7 F (37.1 C) (11/14 0430) Pulse Rate:  [78-96] 78 (11/14 0430) Resp:  [14-18] 17 (11/14 0430) BP: (97-133)/(51-90) 105/59 (11/14 0430) SpO2:  [99 %-100 %] 100 % (11/14 0430) Weight:  [72.6 kg] 72.6 kg (11/13 0913) Last BM Date : 05/03/23  Intake/Output from previous day: 11/13 0701 - 11/14 0700 In: 1000 [IV Piggyback:1000] Out: -  Intake/Output this shift: No intake/output data recorded.  Abdomen: Tenderness to upper quadrant noted  Lab Results:  Recent Labs    05/03/23 1039  WBC 8.1  HGB 11.8*  HCT 36.0  PLT 169   BMET Recent Labs    05/03/23 1039  NA 135  K 3.9  CL 102  CO2 25  GLUCOSE 104*  BUN 5*  CREATININE 0.74  CALCIUM 9.8   PT/INR No results for input(s): "LABPROT", "INR" in the last 72 hours. ABG No results for input(s): "PHART", "HCO3" in the last 72 hours.  Invalid input(s): "PCO2", "PO2"  Studies/Results: CT ABDOMEN PELVIS W CONTRAST  Result Date: 05/03/2023 CLINICAL DATA:  Right flank pain. EXAM: CT ABDOMEN AND PELVIS WITH CONTRAST TECHNIQUE: Multidetector CT imaging of the abdomen and pelvis was performed using the standard protocol following bolus administration of intravenous contrast. RADIATION DOSE REDUCTION: This exam was performed according to the departmental dose-optimization program which includes automated exposure control, adjustment of the mA and/or kV according to patient size and/or use of iterative reconstruction technique. CONTRAST:  80mL OMNIPAQUE IOHEXOL 300 MG/ML  SOLN COMPARISON:  Right upper quadrant ultrasound from same day. CT abdomen pelvis dated January 16, 2023. FINDINGS: Lower chest: No acute abnormality. Hepatobiliary: No focal liver abnormality. Small gallstones. No gallbladder wall thickening or biliary dilatation. Pancreas: Unremarkable. No pancreatic  ductal dilatation or surrounding inflammatory changes. Spleen: Normal in size without focal abnormality. Adrenals/Urinary Tract: Adrenal glands are unremarkable. Kidneys are normal, without renal calculi, focal lesion, or hydronephrosis. Bladder is unremarkable. Stomach/Bowel: Stomach is within normal limits. Appendix appears normal. No evidence of bowel wall thickening, distention, or inflammatory changes. Vascular/Lymphatic: No significant vascular findings are present. No enlarged abdominal or pelvic lymph nodes. Reproductive: Uterus and bilateral adnexa are unremarkable. Left ovarian corpus luteum noted. Other: Trace free fluid inferior to the cecum, nonspecific. No pneumoperitoneum. Musculoskeletal: No acute or significant osseous findings. IMPRESSION: 1. No acute intra-abdominal process. Normal appendix. 2. Unchanged cholelithiasis. 3. Trace free fluid inferior to the cecum, nonspecific. Electronically Signed   By: Obie Dredge M.D.   On: 05/03/2023 16:38   US Abdomen Limited RUQ (LIVER/GB)  Result Date: 05/03/2023 CLINICAL DATA:  Right upper quadrant pain EXAM: ULTRASOUND ABDOMEN LIMITED RIGHT UPPER QUADRANT COMPARISON:  CT 01/16/2023. FINDINGS: Gallbladder: Distended gallbladder with layering dependent stones. No wall thickening or adjacent fluid. No reported sonographic Murphy's sign. Common bile duct: Diameter: 5 mm Liver: No focal lesion identified. Within normal limits in parenchymal echogenicity. Portal vein is patent on color Doppler imaging with normal direction of blood flow towards the liver. Other: Question subtle areas of free fluid. IMPRESSION: Distended gallbladder with stones. No ductal dilatation. No further sonographic evidence of acute cholecystitis. Question subtle areas of free fluid in the abdomen. Please correlate with clinical presentation and if needed further workup with CT as clinically appropriate Electronically Signed   By: Karen Kays M.D.   On: 05/03/2023 13:19     Anti-infectives: Anti-infectives (  From admission, onward)    None       Assessment/Plan: Acute cholecystitis with questionable choledocholithiasis  Bilirubin normal   Plan for laparoscopic cholecystectomy with intraoperative cholangiogram today. The procedure has been discussed with the patient. Operative and non operative treatments have been discussed. Risks of surgery include bleeding, infection,  Common bile duct injury,  Injury to the stomach,liver, colon,small intestine, abdominal wall,  Diaphragm,  Major blood vessels,  And the need for an open procedure.  Other risks include worsening of medical problems, death,  DVT and pulmonary embolism, and cardiovascular events.   Medical options have also been discussed. The patient has been informed of long term expectations of surgery and non surgical options,  The patient agrees to proceed.     LOS: 1 day    Kayla Ayala 05/04/2023

## 2023-05-04 NOTE — Interval H&P Note (Signed)
History and Physical Interval Note:  05/04/2023 11:15 AM  Kayla Ayala  has presented today for surgery, with the diagnosis of CHOLECYSTITIS.  The various methods of treatment have been discussed with the patient and family. After consideration of risks, benefits and other options for treatment, the patient has consented to  Procedure(s): LAPAROSCOPIC CHOLECYSTECTOMY WITH INTRAOPERATIVE CHOLANGIOGRAM (N/A) as a surgical intervention.  The patient's history has been reviewed, patient examined, no change in status, stable for surgery.  I have reviewed the patient's chart and labs.  Questions were answered to the patient's satisfaction.     Debara Kamphuis A Lamica Mccart

## 2023-05-04 NOTE — Op Note (Signed)
Laparoscopic Cholecystectomy with IOC Procedure Note  Indications: This patient presents with symptomatic gallbladder disease and will undergo laparoscopic cholecystectomy.The procedure has been discussed with the patient. Operative and non operative treatments have been discussed. Risks of surgery include bleeding, infection,  Common bile duct injury,  Injury to the stomach,liver, colon,small intestine, abdominal wall,  Diaphragm,  Major blood vessels,  And the need for an open procedure.  Other risks include worsening of medical problems, death,  DVT and pulmonary embolism, and cardiovascular events.   Medical options have also been discussed. The patient has been informed of long term expectations of surgery and non surgical options,  The patient agrees to proceed.     Pre-operative Diagnosis: Calculus of gallbladder with acute cholecystitis, without mention of obstruction  Post-operative Diagnosis: Same  Surgeon: Dortha Schwalbe  MD   Assistants: OR staff  Anesthesia: General endotracheal anesthesia and Local anesthesia 0.25.% bupivacaine, with epinephrine  ASA Class: 1, 2  Procedure Details  The patient was seen again in the Holding Room. The risks, benefits, complications, treatment options, and expected outcomes were discussed with the patient. The possibilities of reaction to medication, pulmonary aspiration, perforation of viscus, bleeding, recurrent infection, finding a normal gallbladder, the need for additional procedures, failure to diagnose a condition, the possible need to convert to an open procedure, and creating a complication requiring transfusion or operation were discussed with the patient. The patient and/or family concurred with the proposed plan, giving informed consent. The site of surgery properly noted/marked. The patient was taken to Operating Room, identified as Kayla Ayala and the procedure verified as Laparoscopic Cholecystectomy with Intraoperative Cholangiograms.  A Time Out was held and the above information confirmed.  Prior to the induction of general anesthesia, antibiotic prophylaxis was administered. General endotracheal anesthesia was then administered and tolerated well. After the induction, the abdomen was prepped in the usual sterile fashion. The patient was positioned in the supine position with the left arm comfortably tucked, along with some reverse Trendelenburg.  Local anesthetic agent was injected into the skin near the umbilicus and an incision made. The midline fascia was incised and the Hasson technique was used to introduce a 12 mm port under direct vision. It was secured with a figure of eight Vicryl suture placed in the usual fashion. Pneumoperitoneum was then created with CO2 and tolerated well without any adverse changes in the patient's vital signs. Additional trocars were introduced under direct vision with an 11 mm trocar in the epigastrium and 2 5 mm trocars in the right upper quadrant. All skin incisions were infiltrated with a local anesthetic agent before making the incision and placing the trocars.   The gallbladder was identified, the fundus grasped and retracted cephalad. Adhesions were lysed bluntly and with the electrocautery where indicated, taking care not to injure any adjacent organs or viscus. The infundibulum was grasped and retracted laterally, exposing the peritoneum overlying the triangle of Calot. This was then divided and exposed in a blunt fashion. The cystic duct was clearly identified and bluntly dissected circumferentially. The junctions of the gallbladder, cystic duct and common bile duct were clearly identified prior to the division of any linear structure.   An incision was made in the cystic duct and the cholangiogram catheter introduced. The catheter was secured using an endoclip. The study showed no stones and good visualization of the distal and proximal biliary tree. The catheter was then removed.   The  cystic duct was then  ligated with surgical clips  on the patient side and  clipped on the gallbladder side and divided. The cystic artery was identified, dissected free, ligated with clips and divided as well. Posterior cystic artery clipped and divided.  The gallbladder was dissected from the liver bed in retrograde fashion with the electrocautery. The gallbladder was removed. The liver bed was irrigated and inspected. Hemostasis was achieved with the electrocautery. Copious irrigation was utilized and was repeatedly aspirated until clear all particulate matter. Hemostasis was achieved with no signs  Of bleeding or bile leakage.  Pneumoperitoneum was completely reduced after viewing removal of the trocars under direct vision. The wound was thoroughly irrigated and the fascia was then closed with a figure of eight suture; the skin was then closed with 4 O monocryl  and a sterile dressing of Dermabond  was applied.  Instrument, sponge, and needle counts were correct at closure and at the conclusion of the case.   Findings: Cholecystitis with Cholelithiasis  Estimated Blood Loss: less than 50 mL         Drains: NONE         Total IV Fluids: per OR record          Specimens: Gallbladder           Complications: None; patient tolerated the procedure well.         Disposition: PACU - hemodynamically stable.         Condition: stable

## 2023-05-04 NOTE — Progress Notes (Signed)
Day of Surgery   Subjective/Chief Complaint: Right upper quadrant pain   Objective: Vital signs in last 24 hours: Temp:  [98.1 F (36.7 C)-99.2 F (37.3 C)] 98.7 F (37.1 C) (11/14 0430) Pulse Rate:  [78-96] 78 (11/14 0430) Resp:  [14-18] 17 (11/14 0430) BP: (97-133)/(51-90) 105/59 (11/14 0430) SpO2:  [99 %-100 %] 100 % (11/14 0430) Weight:  [72.6 kg] 72.6 kg (11/13 0913) Last BM Date : 05/03/23  Intake/Output from previous day: 11/13 0701 - 11/14 0700 In: 1000 [IV Piggyback:1000] Out: -  Intake/Output this shift: No intake/output data recorded.  Abdomen: Tenderness to upper quadrant noted  Lab Results:  Recent Labs    05/03/23 1039  WBC 8.1  HGB 11.8*  HCT 36.0  PLT 169   BMET Recent Labs    05/03/23 1039  NA 135  K 3.9  CL 102  CO2 25  GLUCOSE 104*  BUN 5*  CREATININE 0.74  CALCIUM 9.8   PT/INR No results for input(s): "LABPROT", "INR" in the last 72 hours. ABG No results for input(s): "PHART", "HCO3" in the last 72 hours.  Invalid input(s): "PCO2", "PO2"  Studies/Results: CT ABDOMEN PELVIS W CONTRAST  Result Date: 05/03/2023 CLINICAL DATA:  Right flank pain. EXAM: CT ABDOMEN AND PELVIS WITH CONTRAST TECHNIQUE: Multidetector CT imaging of the abdomen and pelvis was performed using the standard protocol following bolus administration of intravenous contrast. RADIATION DOSE REDUCTION: This exam was performed according to the departmental dose-optimization program which includes automated exposure control, adjustment of the mA and/or kV according to patient size and/or use of iterative reconstruction technique. CONTRAST:  80mL OMNIPAQUE IOHEXOL 300 MG/ML  SOLN COMPARISON:  Right upper quadrant ultrasound from same day. CT abdomen pelvis dated January 16, 2023. FINDINGS: Lower chest: No acute abnormality. Hepatobiliary: No focal liver abnormality. Small gallstones. No gallbladder wall thickening or biliary dilatation. Pancreas: Unremarkable. No pancreatic  ductal dilatation or surrounding inflammatory changes. Spleen: Normal in size without focal abnormality. Adrenals/Urinary Tract: Adrenal glands are unremarkable. Kidneys are normal, without renal calculi, focal lesion, or hydronephrosis. Bladder is unremarkable. Stomach/Bowel: Stomach is within normal limits. Appendix appears normal. No evidence of bowel wall thickening, distention, or inflammatory changes. Vascular/Lymphatic: No significant vascular findings are present. No enlarged abdominal or pelvic lymph nodes. Reproductive: Uterus and bilateral adnexa are unremarkable. Left ovarian corpus luteum noted. Other: Trace free fluid inferior to the cecum, nonspecific. No pneumoperitoneum. Musculoskeletal: No acute or significant osseous findings. IMPRESSION: 1. No acute intra-abdominal process. Normal appendix. 2. Unchanged cholelithiasis. 3. Trace free fluid inferior to the cecum, nonspecific. Electronically Signed   By: Obie Dredge M.D.   On: 05/03/2023 16:38   US Abdomen Limited RUQ (LIVER/GB)  Result Date: 05/03/2023 CLINICAL DATA:  Right upper quadrant pain EXAM: ULTRASOUND ABDOMEN LIMITED RIGHT UPPER QUADRANT COMPARISON:  CT 01/16/2023. FINDINGS: Gallbladder: Distended gallbladder with layering dependent stones. No wall thickening or adjacent fluid. No reported sonographic Murphy's sign. Common bile duct: Diameter: 5 mm Liver: No focal lesion identified. Within normal limits in parenchymal echogenicity. Portal vein is patent on color Doppler imaging with normal direction of blood flow towards the liver. Other: Question subtle areas of free fluid. IMPRESSION: Distended gallbladder with stones. No ductal dilatation. No further sonographic evidence of acute cholecystitis. Question subtle areas of free fluid in the abdomen. Please correlate with clinical presentation and if needed further workup with CT as clinically appropriate Electronically Signed   By: Karen Kays M.D.   On: 05/03/2023 13:19     Anti-infectives: Anti-infectives (  From admission, onward)    None       Assessment/Plan: Acute cholecystitis with questionable choledocholithiasis  Bilirubin normal   Plan for laparoscopic cholecystectomy with intraoperative cholangiogram today. The procedure has been discussed with the patient. Operative and non operative treatments have been discussed. Risks of surgery include bleeding, infection,  Common bile duct injury,  Injury to the stomach,liver, colon,small intestine, abdominal wall,  Diaphragm,  Major blood vessels,  And the need for an open procedure.  Other risks include worsening of medical problems, death,  DVT and pulmonary embolism, and cardiovascular events.   Medical options have also been discussed. The patient has been informed of long term expectations of surgery and non surgical options,  The patient agrees to proceed.     LOS: 1 day    Kayla Ayala Rigor 05/04/2023

## 2023-05-04 NOTE — Anesthesia Procedure Notes (Signed)
Procedure Name: Intubation Date/Time: 05/04/2023 11:56 AM  Performed by: Alwyn Ren, CRNAPre-anesthesia Checklist: Patient identified, Emergency Drugs available, Suction available and Patient being monitored Patient Re-evaluated:Patient Re-evaluated prior to induction Oxygen Delivery Method: Circle system utilized Preoxygenation: Pre-oxygenation with 100% oxygen Induction Type: IV induction Ventilation: Mask ventilation without difficulty Laryngoscope Size: Mac and 4 Grade View: Grade I Tube type: Oral Tube size: 7.0 mm Number of attempts: 1 Airway Equipment and Method: Stylet and Oral airway Placement Confirmation: ETT inserted through vocal cords under direct vision, positive ETCO2 and breath sounds checked- equal and bilateral Secured at: 23 cm Tube secured with: Tape Dental Injury: Teeth and Oropharynx as per pre-operative assessment

## 2023-05-04 NOTE — Discharge Instructions (Addendum)
[facility]  Please arrive at least 30 min before your appointment to complete your check in paperwork.  If you are unable to arrive 30 min prior to your appointment time we may have to cancel or reschedule you. LAPAROSCOPIC SURGERY: POST OP INSTRUCTIONS Always review your discharge instruction sheet given to you by the facility where your surgery was performed. IF YOU HAVE DISABILITY OR FAMILY LEAVE FORMS, YOU MUST BRING THEM TO THE OFFICE FOR PROCESSING.   DO NOT GIVE THEM TO YOUR DOCTOR.  PAIN CONTROL  First take acetaminophen (Tylenol) AND/or ibuprofen (Advil) to control your pain after surgery.  Follow directions on package.  Taking acetaminophen (Tylenol) and/or ibuprofen (Advil) regularly after surgery will help to control your pain and lower the amount of prescription pain medication you may need.  You should not take more than 4,000 mg (4 grams) of acetaminophen (Tylenol) in 24 hours.  You should not take ibuprofen (Advil), aleve, motrin, naprosyn or other NSAIDS if you have a history of stomach ulcers or chronic kidney disease.  A prescription for pain medication may be given to you upon discharge.  Take your pain medication as prescribed, if you still have uncontrolled pain after taking acetaminophen (Tylenol) or ibuprofen (Advil). Use ice packs to help control pain. If you need a refill on your pain medication, please contact your pharmacy.  They will contact our office to request authorization. Prescriptions will not be filled after 5pm or on week-ends.  HOME MEDICATIONS Take your usually prescribed medications unless otherwise directed.  DIET You should follow a light diet the first few days after arrival home.  Be sure to include lots of fluids daily. Avoid fatty, fried foods.   CONSTIPATION It is common to experience some constipation after surgery and if you are taking pain medication.  Increasing fluid intake and taking a stool softener (such as Colace)  will usually help or prevent this problem from occurring.  A mild laxative (Milk of Magnesia or Miralax) should be taken according to package instructions if there are no bowel movements after 48 hours.  WOUND/INCISION CARE Most patients will experience some swelling and bruising in the area of the incisions.  Ice packs will help.  Swelling and bruising can take several days to resolve.  Unless discharge instructions indicate otherwise, follow guidelines below  STERI-STRIPS - you may remove your outer bandages 48 hours after surgery, and you may shower at that time.  You have steri-strips (small skin tapes) in place directly over the incision.  These strips should be left on the skin for 7-10 days.   DERMABOND/SKIN GLUE - you may shower in 24 hours.  The glue will flake off over the next 2-3 weeks. Any sutures or staples will be removed at the office during your follow-up visit.  ACTIVITIES You may resume regular (light) daily activities beginning the next day--such as daily self-care, walking, climbing stairs--gradually increasing activities as tolerated.  You may have sexual intercourse when it is comfortable.  Refrain from any heavy lifting or straining until approved by your doctor. You may drive when you are no longer taking prescription pain medication, you can comfortably wear a seatbelt, and you can safely maneuver your car and apply brakes.  FOLLOW-UP You should see your doctor in the office for a follow-up appointment approximately 2-3 weeks after your surgery.  You should have been given your post-op/follow-up appointment when your surgery was scheduled.  If you did not receive a post-op/follow-up appointment, make sure  that you call for this appointment within a day or two after you arrive home to insure a convenient appointment time.  OTHER INSTRUCTIONS  WHEN TO CALL YOUR DOCTOR: Fever over 101.0 Inability to urinate Continued bleeding from incision. Increased pain, redness, or  drainage from the incision. Increasing abdominal pain  The clinic staff is available to answer your questions during regular business hours.  Please don't hesitate to call and ask to speak to one of the nurses for clinical concerns.  If you have a medical emergency, go to the nearest emergency room or call 911.  A surgeon from [facility] is always on call at the hospital. [address], [place], [place]  [address] ? [address] [address], [place], [place]   [address] [phone] ? [phone] ? FAX [phone] RETURN TO WORK IN 10 DAYS

## 2023-05-05 ENCOUNTER — Encounter (HOSPITAL_COMMUNITY): Payer: Self-pay | Admitting: Surgery

## 2023-05-05 LAB — SURGICAL PATHOLOGY

## 2023-05-05 MED ORDER — OXYCODONE HCL 5 MG PO TABS: 5.0000 mg | ORAL_TABLET | Freq: Four times a day (QID) | ORAL | 0 refills | Status: AC | PRN

## 2023-05-05 NOTE — Discharge Summary (Signed)
Physician Discharge Summary  Patient ID: Kayla Ayala MRN: [id number] DOB/AGE: September 20, 1985 37 y.o.  Admit date: 05/03/2023 Discharge date: 05/05/2023  Admission Diagnoses:acute cholecystits  Discharge Diagnoses:  Principal Problem:   Choledocholithiasis  Same  Discharged Condition: good  Hospital Course: Pt did well after LGB/IOC. IOC normal. Good pain control and tolerating diet. D/C POD 1        Treatments: surgery: SEE ABOVE   Discharge Exam: Blood pressure 120/74, pulse 89, temperature 99 F (37.2 C), temperature source Oral, resp. rate 17, height 5\' 3"  (1.6 m), weight 72.6 kg, last menstrual period 04/11/2023, SpO2 100%, unknown if currently breastfeeding. General appearance: alert and cooperative Resp: WOB nl  Cardio: nsr  Incision/Wound:CDI port sites soft sore   Disposition: Discharge disposition: 01-Home or Self Care       Discharge Instructions     Diet - low sodium heart healthy   Complete by: As directed    Increase activity slowly   Complete by: As directed       Allergies as of 05/05/2023       Reactions   Amoxicillin Hives        Medication List     TAKE these medications    oxyCODONE 5 MG immediate release tablet Commonly known as: Oxy IR/ROXICODONE Take 1 tablet (5 mg total) by mouth every 6 (six) hours as needed for severe pain (pain score 7-10).        Follow-up Information     Harriette Bouillon, MD. Go on 05/29/2023.   Specialty: General Surgery Why: Your appointment is 12/9 at 3:20pm Arrive early to check in, fill out paperwork, Bring photo ID and insurance information Contact information: [address] [place] [address] [phone]                 Signed: Dortha Schwalbe 05/05/2023, 10:33 AM

## 2023-05-05 NOTE — Progress Notes (Addendum)
Discharge instructions reviewed with pt and her family at bedside.  Copy of instructions given to pt. Pt informed script sent to her pharmacy for pick up.  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  Pt getting dressed.  Pt to be d/c'd via wheelchair with belongings, with family.          To be escorted by hospital volunteer.   1202-- waiting for PA to place note in EPIC so it can be printed and given to pt.   1220--note obtained and printed and given to pt at this time, volunteers called to transport out for discharge   Raheel Kunkle,RN SWOT

## 2023-05-05 NOTE — Plan of Care (Deleted)

## 2023-05-05 NOTE — Anesthesia Postprocedure Evaluation (Signed)
Anesthesia Post Note  Patient: Kayla Ayala  Procedure(s) Performed: LAPAROSCOPIC CHOLECYSTECTOMY WITH INTRAOPERATIVE CHOLANGIOGRAM     Patient location during evaluation: PACU Anesthesia Type: General Level of consciousness: awake and alert Pain management: pain level controlled Vital Signs Assessment: post-procedure vital signs reviewed and stable Respiratory status: spontaneous breathing, nonlabored ventilation, respiratory function stable and patient connected to nasal cannula oxygen Cardiovascular status: blood pressure returned to baseline and stable Postop Assessment: no apparent nausea or vomiting Anesthetic complications: no   No notable events documented.  Last Vitals:  Vitals:   05/04/23 2324 05/05/23 0426  BP: 107/64 127/71  Pulse: 100 88  Resp: 18 18  Temp: 36.8 C 36.8 C  SpO2: 100% 100%    Last Pain:  Vitals:   05/05/23 0426  TempSrc: Oral  PainSc:                  Shraddha Lebron S

## 2023-05-05 NOTE — Plan of Care (Signed)
  Problem: Education: Goal: Knowledge of General Education information will improve Description: Including pain rating scale, medication(s)/side effects and non-pharmacologic comfort measures Outcome: Progressing   Problem: Health Behavior/Discharge Planning: Goal: Ability to manage health-related needs will improve Outcome: Progressing   Problem: Clinical Measurements: Goal: Ability to maintain clinical measurements within normal limits will improve Outcome: Progressing Goal: Will remain free from infection Outcome: Progressing Goal: Diagnostic test results will improve Outcome: Progressing Goal: Respiratory complications will improve Outcome: Progressing Goal: Cardiovascular complication will be avoided Outcome: Progressing   Problem: Activity: Goal: Risk for activity intolerance will decrease Outcome: Progressing   Problem: Nutrition: Goal: Adequate nutrition will be maintained Outcome: Progressing   Problem: Pain Management: Goal: General experience of comfort will improve Outcome: Progressing   Problem: Elimination: Goal: Will not experience complications related to bowel motility Outcome: Progressing Goal: Will not experience complications related to urinary retention Outcome: Progressing

## 2023-05-05 NOTE — Plan of Care (Signed)
  Problem: Education: Goal: Knowledge of General Education information will improve Description: Including pain rating scale, medication(s)/side effects and non-pharmacologic comfort measures 05/05/2023 1259 by Kelli Hope, RN Outcome: Adequate for Discharge 05/05/2023 1258 by Kelli Hope, RN Outcome: Adequate for Discharge   Problem: Health Behavior/Discharge Planning: Goal: Ability to manage health-related needs will improve 05/05/2023 1259 by Kelli Hope, RN Outcome: Adequate for Discharge 05/05/2023 1258 by Kelli Hope, RN Outcome: Adequate for Discharge   Problem: Clinical Measurements: Goal: Ability to maintain clinical measurements within normal limits will improve 05/05/2023 1259 by Kelli Hope, RN Outcome: Adequate for Discharge 05/05/2023 1258 by Kelli Hope, RN Outcome: Adequate for Discharge Goal: Will remain free from infection 05/05/2023 1259 by Kelli Hope, RN Outcome: Adequate for Discharge 05/05/2023 1258 by Kelli Hope, RN Outcome: Adequate for Discharge Goal: Diagnostic test results will improve 05/05/2023 1259 by Kelli Hope, RN Outcome: Adequate for Discharge 05/05/2023 1258 by Kelli Hope, RN Outcome: Adequate for Discharge Goal: Respiratory complications will improve 05/05/2023 1259 by Kelli Hope, RN Outcome: Adequate for Discharge 05/05/2023 1258 by Kelli Hope, RN Outcome: Adequate for Discharge Goal: Cardiovascular complication will be avoided 05/05/2023 1259 by Kelli Hope, RN Outcome: Adequate for Discharge 05/05/2023 1258 by Kelli Hope, RN Outcome: Adequate for Discharge   Problem: Activity: Goal: Risk for activity intolerance will decrease 05/05/2023 1259 by Kelli Hope, RN Outcome: Adequate for Discharge 05/05/2023 1258 by Kelli Hope, RN Outcome: Adequate for Discharge   Problem: Nutrition: Goal: Adequate nutrition will be maintained 05/05/2023 1259 by  Kelli Hope, RN Outcome: Adequate for Discharge 05/05/2023 1258 by Kelli Hope, RN Outcome: Adequate for Discharge   Problem: Coping: Goal: Level of anxiety will decrease 05/05/2023 1259 by Kelli Hope, RN Outcome: Adequate for Discharge 05/05/2023 1258 by Kelli Hope, RN Outcome: Adequate for Discharge   Problem: Elimination: Goal: Will not experience complications related to bowel motility 05/05/2023 1259 by Kelli Hope, RN Outcome: Adequate for Discharge 05/05/2023 1258 by Kelli Hope, RN Outcome: Adequate for Discharge Goal: Will not experience complications related to urinary retention 05/05/2023 1259 by Kelli Hope, RN Outcome: Adequate for Discharge 05/05/2023 1258 by Kelli Hope, RN Outcome: Adequate for Discharge   Problem: Pain Management: Goal: General experience of comfort will improve 05/05/2023 1259 by Kelli Hope, RN Outcome: Adequate for Discharge 05/05/2023 1258 by Kelli Hope, RN Outcome: Adequate for Discharge   Problem: Safety: Goal: Ability to remain free from injury will improve 05/05/2023 1259 by Kelli Hope, RN Outcome: Adequate for Discharge 05/05/2023 1258 by Kelli Hope, RN Outcome: Adequate for Discharge   Problem: Skin Integrity: Goal: Risk for impaired skin integrity will decrease 05/05/2023 1259 by Kelli Hope, RN Outcome: Adequate for Discharge 05/05/2023 1258 by Kelli Hope, RN Outcome: Adequate for Discharge

## 2023-05-05 NOTE — Progress Notes (Signed)
I have reviewed and concur with this student's documentation.   Reva Bores, RN 05/05/2023 2:06 PM
